# Patient Record
Sex: Female | Born: 1995 | Race: Black or African American | Hispanic: No | Marital: Single | Smoking: Never smoker
Health system: Southern US, Community
[De-identification: ages and names within clinical notes are randomized; demographics above are authoritative.]

## PROBLEM LIST (undated history)

## (undated) ENCOUNTER — Inpatient Hospital Stay: Payer: Self-pay

## (undated) DIAGNOSIS — J45909 Unspecified asthma, uncomplicated: Secondary | ICD-10-CM

## (undated) HISTORY — DX: Unspecified asthma, uncomplicated: J45.909

---
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
## 2016-01-26 ENCOUNTER — Encounter: Payer: PRIVATE HEALTH INSURANCE | Admitting: Obstetrics and Gynecology

## 2016-02-08 ENCOUNTER — Ambulatory Visit (INDEPENDENT_AMBULATORY_CARE_PROVIDER_SITE_OTHER): Payer: PRIVATE HEALTH INSURANCE | Admitting: Obstetrics and Gynecology

## 2016-02-08 ENCOUNTER — Other Ambulatory Visit: Payer: Self-pay | Admitting: Obstetrics and Gynecology

## 2016-02-08 VITALS — BP 114/67 | HR 94 | Wt 180.6 lb

## 2016-02-08 DIAGNOSIS — R7989 Other specified abnormal findings of blood chemistry: Secondary | ICD-10-CM

## 2016-02-08 DIAGNOSIS — Z3402 Encounter for supervision of normal first pregnancy, second trimester: Secondary | ICD-10-CM

## 2016-02-08 LAB — POCT URINALYSIS DIPSTICK
Bilirubin, UA: NEGATIVE
Blood, UA: NEGATIVE
GLUCOSE UA: NEGATIVE
Ketones, UA: NEGATIVE
Nitrite, UA: NEGATIVE
Protein, UA: NEGATIVE
SPEC GRAV UA: 1.02
UROBILINOGEN UA: 0.2
pH, UA: 6.5

## 2016-02-08 NOTE — Progress Notes (Signed)
ROB: Patient notes lower back pain.  Has not tried anything.  Recommend Tylenol and/or heating pads. For quad screen today.  Will recheck thyroid levels as last TSH was mildly elevated. For anatomy scan in 1-2 weeks. RTC in 4 weeks.

## 2016-02-09 LAB — THYROID PANEL WITH TSH
FREE THYROXINE INDEX: 1.4 (ref 1.2–4.9)
T3 UPTAKE RATIO: 15 % — AB (ref 24–39)
T4 TOTAL: 9.3 ug/dL (ref 4.5–12.0)
TSH: 1.01 u[IU]/mL (ref 0.450–4.500)

## 2016-02-10 ENCOUNTER — Telehealth: Payer: Self-pay

## 2016-02-10 NOTE — Telephone Encounter (Signed)
Pt informed

## 2016-02-10 NOTE — Telephone Encounter (Signed)
-----   Message from Hildred LaserAnika Cherry, MD sent at 02/09/2016  1:49 PM EDT ----- Please inform of normal thyroid levels.

## 2016-02-11 LAB — AFP, SERUM, OPEN SPINA BIFIDA
AFP MoM: 0.87
AFP Value: 41.8 ng/mL
GEST. AGE ON COLLECTION DATE: 19 wk
MATERNAL AGE AT EDD: 19.7 a
OSBR Risk 1 IN: 10000
Test Results:: NEGATIVE
WEIGHT: 180 [lb_av]

## 2016-02-14 ENCOUNTER — Encounter: Payer: Self-pay | Admitting: Internal Medicine

## 2016-02-14 ENCOUNTER — Ambulatory Visit (INDEPENDENT_AMBULATORY_CARE_PROVIDER_SITE_OTHER): Payer: PRIVATE HEALTH INSURANCE | Admitting: Internal Medicine

## 2016-02-14 VITALS — BP 116/68 | HR 93 | Temp 98.8°F | Wt 181.0 lb

## 2016-02-14 DIAGNOSIS — J359 Chronic disease of tonsils and adenoids, unspecified: Secondary | ICD-10-CM | POA: Diagnosis not present

## 2016-02-14 NOTE — Patient Instructions (Signed)

## 2016-02-14 NOTE — Progress Notes (Signed)
Subjective:    Patient ID: Connie Holmes, female    DOB: 12/23/1995, 20 y.o.   MRN: [id number]  HPI  Pt presents to the clinic today with c/o a "spot" on her tonsil. She noticed this 3 days ago. It is not causing her any pain or discomfort, she just notices it is there when she swallows. She denies runny nose, sore throat or cough. She has not tried anything OTC for this.  Review of Systems      Past Medical History  Diagnosis Date  . Childhood asthma     Current Outpatient Prescriptions  Medication Sig Dispense Refill  . Prenatal Multivit-Min-Fe-FA (PRENATAL VITAMINS PO) Take 1 tablet by mouth daily. Reported on 12/07/2015     No current facility-administered medications for this visit.    No Known Allergies  Family History  Problem Relation Age of Onset  . Asthma Mother   . Cancer Maternal Grandfather     lung  . Cancer Paternal Grandfather     lung  . Diabetes Paternal Grandmother   . Diabetes Mother   . Hypertension Mother   . Hypertension Maternal Grandfather   . Hypertension Paternal Grandmother   . Hypertension Maternal Grandmother     Social History   Social History  . Marital Status: Single    Spouse Name: N/A  . Number of Children: N/A  . 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 Smoking status: Never Smoker   . Smokeless tobacco: Never Used  . Alcohol Use: No  . Drug Use: No  . Sexual Activity:    Partners: Male    Birth Control/ Protection: None     Comment: Pregnant    Other Topics Concern  . Not on file   Social History Narrative     Constitutional: Denies fever, malaise, fatigue, headache or abrupt weight changes.  HEENT: Pt reports "spot" on tonsil. Denies eye pain, eye redness, ear pain, ringing in the ears, wax buildup, runny nose, nasal congestion, bloody nose, or sore throat. Respiratory: Denies difficulty breathing, shortness of breath, cough or sputum production.     Cardiovascular: Denies chest pain, chest tightness, palpitations or swelling in the hands or feet.   No other specific complaints in a complete review of systems (except as listed in HPI above).   Objective:   Physical Exam  BP 116/68 mmHg  Pulse 93  Temp(Src) 98.8 F (37.1 C) (Oral)  Wt 181 lb (82.101 kg)  SpO2 98%  LMP 09/28/2015 (Approximate) Wt Readings from Last 3 Encounters:  02/14/16 181 lb (82.101 kg) (95 %*, Z = 1.63)  02/08/16 180 lb 9.6 oz (81.92 kg) (95 %*, Z = 1.63)  12/29/15 175 lb 14.4 oz (79.788 kg) (94 %*, Z = 1.54)   * Growth percentiles are based on CDC 2-20 Years data.    General: Appears her stated age, well developed, well nourished in NAD. HEENT: Head: normal shape and size; Throat/Mouth: Teeth present, mucosa pink and moist, no exudate. Small papular lesion noted on left tonsillar piller.  Neck:  No adenopathy noted. Cardiovascular: Normal rate and rhythm. S1,S2 noted.  No murmur, rubs or gallops noted. Pulmonary/Chest: Normal effort and positive vesicular breath sounds. No respiratory distress. No wheezes, rales or ronchi noted.   CBC    Component Value Date/Time   WBC 4.0 12/07/2015 1203   WBC 5.8 05/11/2015 1716   RBC 4.40 12/07/2015 1203   RBC 3.98  05/11/2015 1716   HGB 12.7 05/11/2015 1716   HCT 40.9 12/07/2015 1203   HCT 38.5 05/11/2015 1716   PLT 463* 12/07/2015 1203   PLT 398.0 05/11/2015 1716   MCV 93 12/07/2015 1203   MCV 96.7 05/11/2015 1716   MCH 31.8 12/07/2015 1203   MCHC 34.2 12/07/2015 1203   MCHC 33.1 05/11/2015 1716   RDW 14.5 12/07/2015 1203   RDW 14.8 05/11/2015 1716   LYMPHSABS 1.0 12/07/2015 1203   EOSABS 0.1 12/07/2015 1203   BASOSABS 0.0 12/07/2015 1203    Hgb A1C Lab Results  Component Value Date   HGBA1C 5.3 12/07/2015            Assessment & Plan:   Tonsil lesion:  Not concerning at this time Will monitor for now If enlarges or becomes tender/painful, will refer to ENT for further evaluation She  can try salt water gargles OTC  RTC as needed

## 2016-02-17 ENCOUNTER — Telehealth: Payer: Self-pay

## 2016-02-17 NOTE — Telephone Encounter (Signed)
Called pt, line dropped. Will call again. Called x 2

## 2016-02-17 NOTE — Telephone Encounter (Signed)
-----   Message from Hildred LaserAnika Cherry, MD sent at 02/17/2016  7:35 AM EDT ----- Please inform of negative 2nd trimester screening

## 2016-02-24 ENCOUNTER — Inpatient Hospital Stay
Admission: EM | Admit: 2016-02-24 | Discharge: 2016-02-26 | DRG: 782 | Disposition: A | Discharge status: Home / Self Care | Payer: Medicaid Other | Attending: Obstetrics and Gynecology | Admitting: Obstetrics and Gynecology

## 2016-02-24 ENCOUNTER — Other Ambulatory Visit: Payer: Self-pay | Admitting: Obstetrics and Gynecology

## 2016-02-24 ENCOUNTER — Ambulatory Visit (INDEPENDENT_AMBULATORY_CARE_PROVIDER_SITE_OTHER): Payer: PRIVATE HEALTH INSURANCE

## 2016-02-24 ENCOUNTER — Encounter: Payer: Self-pay | Admitting: *Deleted

## 2016-02-24 DIAGNOSIS — Z3A21 21 weeks gestation of pregnancy: Secondary | ICD-10-CM

## 2016-02-24 DIAGNOSIS — O26872 Cervical shortening, second trimester: Principal | ICD-10-CM | POA: Diagnosis present

## 2016-02-24 DIAGNOSIS — Z3402 Encounter for supervision of normal first pregnancy, second trimester: Secondary | ICD-10-CM

## 2016-02-24 MED ORDER — SODIUM CHLORIDE 0.9 % IV SOLN
2.0000 g | Freq: Four times a day (QID) | INTRAVENOUS | Status: DC
  Administered 2016-02-24 – 2016-02-25 (×3): 2 g via INTRAVENOUS
  Filled 2016-02-24 (×3): qty 2000

## 2016-02-24 MED ORDER — SODIUM CHLORIDE 0.9 % IV SOLN
INTRAVENOUS | Status: AC
  Filled 2016-02-24: qty 2000

## 2016-02-24 MED ORDER — LACTATED RINGERS IV SOLN
INTRAVENOUS | Status: DC
  Administered 2016-02-24: 1000 mL via INTRAVENOUS
  Administered 2016-02-25 – 2016-02-26 (×3): via INTRAVENOUS

## 2016-02-24 MED ORDER — ACETAMINOPHEN 325 MG PO TABS: 650.0000 mg | ORAL_TABLET | ORAL | Status: DC | PRN

## 2016-02-24 MED ORDER — CALCIUM CARBONATE ANTACID 500 MG PO CHEW: 2.0000 | CHEWABLE_TABLET | ORAL | Status: DC | PRN

## 2016-02-24 MED ORDER — PROGESTERONE MICRONIZED 200 MG PO CAPS
200.0000 mg | ORAL_CAPSULE | Freq: Every day | ORAL | Status: DC
  Administered 2016-02-24 – 2016-02-25 (×2): 200 mg via VAGINAL
  Filled 2016-02-24 (×4): qty 1

## 2016-02-24 MED ORDER — ZOLPIDEM TARTRATE 5 MG PO TABS: 5.0000 mg | ORAL_TABLET | Freq: Every evening | ORAL | Status: DC | PRN

## 2016-02-24 MED ORDER — DOCUSATE SODIUM 100 MG PO CAPS
100.0000 mg | ORAL_CAPSULE | Freq: Every day | ORAL | Status: DC
  Administered 2016-02-25 – 2016-02-26 (×2): 100 mg via ORAL
  Filled 2016-02-24 (×2): qty 1

## 2016-02-24 NOTE — H&P (Signed)
Connie Holmes is a 20 y.o. female presenting for admission due to shortened cervical length identified at anatomy scan earlier today. Cervical length was 8.9 mm; funneling was identified.  Patient reports having some low back pain over the past several weeks which appeared to be constant. She denies UTI symptoms. She denies fevers chills or sweats. She denies nausea vomiting diarrhea. The patient has not noted any significant vaginal discharge or bleeding. Fetus is active. . Maternal Medical History:  Reason for admission: Nausea.    OB History    Gravida Para Term Preterm AB TAB SAB Ectopic Multiple Living   1              Past Medical History  Diagnosis Date  . Childhood asthma    History reviewed. No pertinent past surgical history. Family History: family history includes Asthma in her mother; Cancer in her maternal grandfather and paternal grandfather; Diabetes in her mother and paternal grandmother; Hypertension in her maternal grandfather, maternal grandmother, mother, and paternal grandmother. Social History:  reports that she has never smoked. She has never used smokeless tobacco. She reports that she does not drink alcohol or use illicit drugs.    Review of Systems  Constitutional: Negative for fever and chills.  HENT: Negative for congestion and sore throat.   Respiratory: Negative for cough, sputum production and shortness of breath.   Gastrointestinal: Negative for nausea, vomiting, abdominal pain and diarrhea.  Genitourinary: Negative for dysuria, urgency, hematuria and flank pain.  Skin: Negative for itching and rash.  Neurological: Negative.   Psychiatric/Behavioral: Negative.       Last menstrual period 09/28/2015. Exam Physical Exam  Constitutional: She is oriented to person, place, and time. She appears well-developed and well-nourished. No distress.  HENT:  Head: Normocephalic and atraumatic.  Cardiovascular: Normal rate and regular rhythm.    Respiratory: Effort normal.  GI: Soft. She exhibits no distension. There is no tenderness. There is no rebound and no guarding.  Genitourinary:  Sterile speculum exam: Cervix appears closed; approximately 1 cm in length; no cervical discharge  External genitalia-normal BUS-normal Vagina-normal; minimal white secretions and vaginal vault; GBS culture obtained Cervix-soft, closed, approximately 1 cm long Uterus-nontender Adnexa-nonpalpable nontender Rectovaginal-normal external exam  Musculoskeletal: She exhibits no edema or tenderness.  Neurological: She is alert and oriented to person, place, and time.  Skin: Skin is warm and dry. No rash noted. No erythema.  Psychiatric: She has a normal mood and affect. Her behavior is normal.    Prenatal labs: ABO, Rh: O/Positive/-- (03/15 1203) Antibody: Negative (03/15 1203) Rubella: <20.0 (03/15 1203) RPR: Non Reactive (03/15 1203)  HBsAg: Negative (03/15 1203)  HIV: Non Reactive (03/15 1203)  GBS:     Assessment/Plan: G1 P0 at 21 0.[redacted] weeks gestation Short cervix on ultrasound; cervical length 8.9 mm; funneling present  PLAN: 1. IV fluids 2. Monitor for contractions/preterm labor 3. GBS culture 4. Ampicillin 2 g IV every 6 hours 5. Progesterone suppositories 200 mg daily 6. Protocol for short cervix in nulliparous patient was reviewed with the patient and her family. According to quotes up to date", a G1 P0 patient with no prior history of preterm delivery, emergency cerclage is not considered to be helpful. Recommendation is for progesterone suppositories 200 mg daily until 36 weeks or delivery. Repeat cervical length scanning is also not recommended in this scenario.   Daphine DeutscherMartin A Leva Baine 02/24/2016, 6:10 PM

## 2016-02-24 NOTE — OB Triage Note (Signed)
Sent from office for cervical cerclage due to shortened cervix with funneling.  Dr. Greggory KeeneFrancesco here and has decided to hold off on cerclage for now and administer progesterone suppositories.

## 2016-02-25 DIAGNOSIS — Z3A21 21 weeks gestation of pregnancy: Secondary | ICD-10-CM | POA: Diagnosis not present

## 2016-02-25 DIAGNOSIS — R110 Nausea: Secondary | ICD-10-CM | POA: Diagnosis present

## 2016-02-25 DIAGNOSIS — O26872 Cervical shortening, second trimester: Secondary | ICD-10-CM | POA: Diagnosis present

## 2016-02-25 MED ORDER — PRENATAL MULTIVITAMIN CH
1.0000 | ORAL_TABLET | Freq: Every day | ORAL | Status: DC
  Administered 2016-02-25 – 2016-02-26 (×2): 1 via ORAL
  Filled 2016-02-25 (×2): qty 1

## 2016-02-25 NOTE — Progress Notes (Signed)
Patient ID: Erich MontaneQuantaza Holmes, female   DOB: 02/27/1996, 20 y.o.   MRN: [id number]  HOSPITAL DAY #1; AMPICILLIN DAY 1 21.3 WEEK IUP-SHORT CERVIX WITH FUNNELING  S: No abdominal pain. Good FM O: BP 129/64 mmHg  Pulse 74  Temp(Src) 97.8 F (36.6 C) (Oral)  Resp 16  Ht 5\' 2"  (1.575 m)  Wt 184 lb (83.462 kg)  BMI 33.65 kg/m2  LMP 09/28/2015 (Approximate)  EFM: no contractions  Alert oriented in NAD  Abd: soft non tender; uterus non tender  Ext: non tender   GBS culture pending  A: Short Cervix with funneling; G1; No risk factors for CI; No evidence for PML P:  1. D/C Ampicillin  2. Regular diet  3. Continue PV progesterone suppository 200 mg daily  4. Transfer to floor  5. Bed rest with BR prvileges; pt may shower  6. Consider MFM consult for long term management.  Herold HarmsMartin A Ajahnae Rathgeber, MD

## 2016-02-26 ENCOUNTER — Encounter: Payer: Self-pay | Admitting: Obstetrics and Gynecology

## 2016-02-26 LAB — CULTURE, BETA STREP (GROUP B ONLY)

## 2016-02-26 LAB — URINE CULTURE
CULTURE: NO GROWTH
SPECIAL REQUESTS: NORMAL

## 2016-02-26 MED ORDER — PROGESTERONE 100 MG VA INST: 100.0000 mg | VAGINAL_INSERT | Freq: Two times a day (BID) | VAGINAL | Status: DC

## 2016-02-26 NOTE — Discharge Instructions (Signed)
Cervical Insufficiency °Cervical insufficiency is when the cervix is weak and starts to open (dilate) and thin (efface) before the pregnancy is at term and without labor starting. This is also called incompetent cervix. It can happen in the second or third trimester when the fetus starts putting pressure on the cervix. Cervical insufficiency can lead to a miscarriage, preterm premature rupture of the membranes (PPROM), or having the baby early (preterm birth).  °RISK FACTORS °You may be more likely to develop cervical insufficiency if: °· You have a shorter cervix than normal. °· Damage or injury occurred to your cervix from a past pregnancy or surgery. °· You were born with a cervical defect. °· You have had a procedure done on the cervix, such as cervical biopsy. °· You have a history of cervical insufficiency. °· You have a history of PPROM. °· You have ended several past pregnancies through abortion. °· You were exposed to the drug diethylstilbestrol (DES). °SYMPTOMS °Often times, women do not have any symptoms. Other times, women may only have mild symptoms that often start between week 14 through 20. The symptoms may last several days or weeks. These symptoms include: °· Light spotting or bleeding from the vagina. °· Pelvic pressure. °· A change in vaginal discharge, such as discharge that changes from clear, white, or light yellow to pink or tan. °· Back pain. °· Abdominal pain or cramping. °DIAGNOSIS °Cervical insufficiency cannot be diagnosed before you become pregnant. Once you are pregnant, your health care provider will ask about your medical history and if you have had any problems in past pregnancies. Tell your health care provider about any procedures performed on your cervix or if you have a history of miscarriages or cervical insufficiency. If your health care provider thinks you are at high risk for cervical insufficiency or show signs of cervical insufficiency, he or she may: °· Perform a pelvic  exam. This will check for: °¨ The presence of the membranes (amniotic sac) coming out of the cervix. °¨ Cervical abnormalities. °¨ Cervical injuries. °¨ The presence of contractions. °· Perform an ultrasonography (commonly called ultrasound) to measure the length and thickness of the cervix. °TREATMENT °If you have been diagnosed with cervical insufficiency, your health care provider may recommend: °· Limiting physical activity. °· Bed rest at home or in the hospital. °· Pelvic rest, which means no sexual intercourse or placing anything in the vagina. °· Cerclage to sew the cervix closed and prevent it from opening too early. The stitches (sutures) are removed between weeks 36 and 38 to avoid problems during labor. °Cerclage may be recommended during pregnancy if you have had a history of miscarriages or preterm births without a known cause. It may also be recommended if you have a short cervix that was identified by ultrasound or if your health care provider has found that your cervix has dilated before 24 weeks of pregnancy. Limiting physical activity and bed rest may or may not help prevent a preterm birth. °WHEN SHOULD YOU SEEK IMMEDIATE MEDICAL CARE?  °Seek immediate medical care if you show any symptoms of cervical insufficiency. You will need to go to the hospital to get checked immediately. °  °This information is not intended to replace advice given to you by your health care provider. Make sure you discuss any questions you have with your health care provider. °  °Document Released: 09/10/2005 Document Revised: 10/01/2014 Document Reviewed: 11/17/2012 °Elsevier Interactive Patient Education ©2016 [employer] ° °

## 2016-02-26 NOTE — Progress Notes (Signed)
  Antenatal Progress Note  Subjective:     Patient ID: Connie Holmes is a 20 y.o. female at 3772w4d, Estimated Date of Delivery: 07/04/16 by Patient's last menstrual period was 09/28/2015 (approximate). consistent with 1st trimester sono who was admitted for short cervix with funneling.  HD# 2.  Subjective:  Patient denies complaints today.  Tolerating diet.    Review of Systems Denies contractions, leakage of fluids, vaginal bleeding, and reports good fetal movement.     Objective:   Filed Vitals:   02/25/16 1655 02/25/16 2023 02/26/16 0800 02/26/16 1216  BP: 117/61 122/62 121/62 116/51  Pulse: 80 93 85 81  Temp: 98.4 F (36.9 C) 98.4 F (36.9 C) 98.3 F (36.8 C) 98.3 F (36.8 C)  TempSrc: Oral Oral Oral Oral  Resp: 16 18 18 18   Height:      Weight:      SpO2:  100% 100%     General appearance: alert and no distress Lungs: clear to auscultation bilaterally Heart: regular rate and rhythm, S1, S2 normal, no murmur, click, rub or gallop Abdomen: soft, non-tender; bowel sounds normal; no masses,  no organomegaly.  Gravid Pelvic: deferred Extremities: extremities normal, atraumatic, no cyanosis or edema   FHT: appreciated   Labs:  GBS pending 02/24/16: Urine culture negative  Assessment:  20 y.o. female 4472w4d, Estimated Date of Delivery: 07/04/16 with:  1. Shortened cervix with funneling   Plan:   1. Regular diet 2. Continue PV progesterone suppository 200 mg daily.  Does not require cerclage at this time based on ACOG recommendations.  3. Will d/c home today.  Patient to maintain pelvic rest.  Can have modified daily activity (no strenous lifting, excessive bending).  4. Will likely have patient f/u with MFM as outpatient.    Hildred LaserAnika Laiba Fuerte, MD [facility] 02/26/2016 1:14 PM

## 2016-02-26 NOTE — Discharge Summary (Signed)
Physician Discharge Summary  Patient ID: Connie Holmes MRN: [id number] DOB/AGE: 19/04/1996 19 y.o.  Admit date: 02/24/2016 Discharge date: 02/26/2016  Admission Diagnoses:  Shortened cervix with funneling  Discharge Diagnoses:  Principal Problem:   Short cervix in second trimester, antepartum   Discharged Condition: good  Hospital Course:  The patient was admitted for observation due to findings of shortened cervix to assess if rescue cerclage was needed.  She was treated for 24 hrs with ampicillin. Patient remained without contractions, no cervical dilation noted.  She was initiated on vaginal progesterone inserts 100 mg BID. Patient was discharged on HD#2.   Consults: None  Significant Diagnostic Studies: radiology: Ultrasound: OB anatomy scan (performed in office)  Indications: Anatomy Scan Findings:  Singleton intrauterine pregnancy is visualized with FHR at 155 BPM. Biometrics give an (U/S) Gestational age of [redacted] weeks and 1 day, and an (U/S) EDD of 07/05/16; this correlates with the clinically established EDD of 07/04/16.  Fetal presentation is vertex, spine posterior.  EFW: 393 grams ( 0 lbs. 14 oz.). Placenta: Anterior, grade 0 and remote to cervix by 5.7 cm.  AFI: Adequate with MVP of 3.4 cm.   CX: During Transabdominal scan, it was noted that the cervix appeared short with possible funneling. A transvaginal ultrasound was performed that verified that the cervix was short at 9.2 mm and 7.3 mm with fundal pressure.  Anatomic survey is complete and appears WNL. Gender - Female.   Right Ovary measures 3.8 x 2.6 x 3.0 cm, and appears WNL. Left Ovary measures 3.8 x 2.4 x 2.2 cm, and appears WNL. There is no evidence of a corpus luteal cyst.. Survey of the adnexa demonstrates no adnexal masses. There is no free peritoneal fluid in the cul de sac.  Impression: 1. 21 week 1 day Viable Singleton Intrauterine pregnancy by U/S. 2. (U/S) EDD is consistent with Clinically  established (LMP) EDD of 07/04/16. 3. Normal appearing anatomy scan. 4. Shortened cervix with funneling. Cervix at 9.2 mm and 7.3 mm with fundal pressure  Recommendations: 1.Clinical correlation with the patient's History and Physical Exam. 2. The on-call doctor ( Dr. Greggory Keenefrancesco) was called and patient was told [facility].   Treatments: antibiotics: ampicillin; and progesterone  Discharge Exam: Blood pressure 116/51, pulse 81, temperature 98.3 F (36.8 C), temperature source Oral, resp. rate 18, height 5\' 2"  (1.575 m), weight 184 lb (83.462 kg), last menstrual period 09/28/2015, SpO2 100 %. General appearance: alert and no distress Lungs: clear to auscultation bilaterally Heart: regular rate and rhythm, S1, S2 normal, no murmur, click, rub or gallop Abdomen: soft, non-tender; bowel sounds normal; no masses, no organomegaly. Gravid Pelvic: deferred Extremities: extremities normal, atraumatic, no cyanosis or edema  FHT: appreciated  Disposition: Home     Medication List    TAKE these medications        PRENATAL VITAMINS PO  Take 1 tablet by mouth daily. Reported on 12/07/2015     progesterone 100 MG vaginal insert  Commonly known as:  ENDOMETRIN  Place 1 tablet (100 mg total) vaginally 2 (two) times daily.           Follow-up Information    Follow up with Connie LaserAnika Estella Malatesta, MD In 1 week.   Specialties:  Obstetrics and Gynecology, Radiology   Contact information:   [address] [place] [address] [phone]       Signed: Hildred LaserAnika Tara Wich, MD [facility] 02/26/2016, 2:16 PM

## 2016-02-26 NOTE — Progress Notes (Signed)
Patient understands all discharge instructions and the need to make follow up appointments. Patient discharge via wheelchair with RN. 

## 2016-02-29 ENCOUNTER — Inpatient Hospital Stay
Admission: EM | Admit: 2016-02-29 | Discharge: 2016-03-01 | DRG: 775 | Disposition: A | Discharge status: Home / Self Care | Payer: Medicaid Other | Attending: Obstetrics and Gynecology | Admitting: Obstetrics and Gynecology

## 2016-02-29 ENCOUNTER — Observation Stay: Payer: Medicaid Other

## 2016-02-29 DIAGNOSIS — O26872 Cervical shortening, second trimester: Secondary | ICD-10-CM

## 2016-02-29 DIAGNOSIS — Z3402 Encounter for supervision of normal first pregnancy, second trimester: Secondary | ICD-10-CM

## 2016-02-29 DIAGNOSIS — R109 Unspecified abdominal pain: Secondary | ICD-10-CM

## 2016-02-29 DIAGNOSIS — O26879 Cervical shortening, unspecified trimester: Secondary | ICD-10-CM | POA: Diagnosis present

## 2016-02-29 DIAGNOSIS — Z3A22 22 weeks gestation of pregnancy: Secondary | ICD-10-CM

## 2016-02-29 DIAGNOSIS — O26899 Other specified pregnancy related conditions, unspecified trimester: Secondary | ICD-10-CM

## 2016-02-29 DIAGNOSIS — O328XX0 Maternal care for other malpresentation of fetus, not applicable or unspecified: Secondary | ICD-10-CM | POA: Diagnosis present

## 2016-02-29 DIAGNOSIS — O3432 Maternal care for cervical incompetence, second trimester: Secondary | ICD-10-CM | POA: Diagnosis present

## 2016-02-29 LAB — URINALYSIS COMPLETE WITH MICROSCOPIC (ARMC ONLY)
BILIRUBIN URINE: NEGATIVE
Glucose, UA: NEGATIVE mg/dL
Hgb urine dipstick: NEGATIVE
KETONES UR: NEGATIVE mg/dL
NITRITE: NEGATIVE
PH: 6 (ref 5.0–8.0)
Protein, ur: 30 mg/dL — AB
Specific Gravity, Urine: 1.024 (ref 1.005–1.030)

## 2016-02-29 LAB — CBC
HCT: 32.2 % — ABNORMAL LOW (ref 35.0–47.0)
HEMOGLOBIN: 10.8 g/dL — AB (ref 12.0–16.0)
MCH: 31.5 pg (ref 26.0–34.0)
MCHC: 33.5 g/dL (ref 32.0–36.0)
MCV: 94 fL (ref 80.0–100.0)
PLATELETS: 409 10*3/uL (ref 150–440)
RBC: 3.42 MIL/uL — AB (ref 3.80–5.20)
RDW: 14.4 % (ref 11.5–14.5)
WBC: 12.3 10*3/uL — ABNORMAL HIGH (ref 3.6–11.0)

## 2016-02-29 LAB — CREATININE, SERUM
CREATININE: 0.48 mg/dL (ref 0.44–1.00)
GFR calc Af Amer: >60 mL/min (ref >60)

## 2016-02-29 LAB — CHLAMYDIA/NGC RT PCR (ARMC ONLY)
Chlamydia Tr: DETECTED — AB
N gonorrhoeae: NOT DETECTED

## 2016-02-29 LAB — TYPE AND SCREEN
ABO/RH(D): O POS
Antibody Screen: NEGATIVE

## 2016-02-29 MED ORDER — ENOXAPARIN SODIUM 40 MG/0.4ML ~~LOC~~ SOLN
40.0000 mg | SUBCUTANEOUS | Status: DC
  Administered 2016-02-29: 40 mg via SUBCUTANEOUS
  Filled 2016-02-29: qty 0.4

## 2016-02-29 MED ORDER — ZOLPIDEM TARTRATE 5 MG PO TABS: 5.0000 mg | ORAL_TABLET | Freq: Every evening | ORAL | Status: DC | PRN

## 2016-02-29 MED ORDER — DIBUCAINE 1 % RE OINT
1.0000 | TOPICAL_OINTMENT | RECTAL | Status: DC | PRN
Start: 2016-02-29 — End: 2016-03-01

## 2016-02-29 MED ORDER — IBUPROFEN 600 MG PO TABS
600.0000 mg | ORAL_TABLET | Freq: Four times a day (QID) | ORAL | Status: DC
  Administered 2016-03-01: 600 mg via ORAL
  Filled 2016-02-29: qty 1

## 2016-02-29 MED ORDER — PRENATAL MULTIVITAMIN CH
1.0000 | ORAL_TABLET | Freq: Every day | ORAL | Status: DC
  Administered 2016-02-29: 1 via ORAL
  Filled 2016-02-29: qty 1

## 2016-02-29 MED ORDER — TETANUS-DIPHTH-ACELL PERTUSSIS 5-2.5-18.5 LF-MCG/0.5 IM SUSP
0.5000 mL | Freq: Once | INTRAMUSCULAR | Status: DC
  Filled 2016-02-29: qty 0.5

## 2016-02-29 MED ORDER — LACTATED RINGERS IV SOLN
INTRAVENOUS | Status: DC
  Administered 2016-02-29 (×2): via INTRAVENOUS

## 2016-02-29 MED ORDER — DIPHENHYDRAMINE HCL 12.5 MG/5ML PO ELIX
12.5000 mg | ORAL_SOLUTION | Freq: Four times a day (QID) | ORAL | Status: DC | PRN
  Filled 2016-02-29: qty 5

## 2016-02-29 MED ORDER — NALOXONE HCL 0.4 MG/ML IJ SOLN: 0.4000 mg | INTRAMUSCULAR | Status: DC | PRN

## 2016-02-29 MED ORDER — WITCH HAZEL-GLYCERIN EX PADS
1.0000 | MEDICATED_PAD | CUTANEOUS | Status: DC | PRN
Start: 2016-02-29 — End: 2016-03-01

## 2016-02-29 MED ORDER — ACETAMINOPHEN-CODEINE #3 300-30 MG PO TABS
1.0000 | ORAL_TABLET | ORAL | Status: DC | PRN
  Administered 2016-02-29: 1 via ORAL
  Administered 2016-02-29: 2 via ORAL
  Filled 2016-02-29: qty 2

## 2016-02-29 MED ORDER — NIFEDIPINE 10 MG PO CAPS: 20.0000 mg | ORAL_CAPSULE | Freq: Four times a day (QID) | ORAL | Status: DC

## 2016-02-29 MED ORDER — OXYCODONE-ACETAMINOPHEN 5-325 MG PO TABS
1.0000 | ORAL_TABLET | Freq: Four times a day (QID) | ORAL | Status: DC | PRN
  Administered 2016-02-29: 2 via ORAL
  Filled 2016-02-29: qty 2

## 2016-02-29 MED ORDER — COCONUT OIL OIL: 1.0000 "application " | TOPICAL_OIL | Status: DC | PRN

## 2016-02-29 MED ORDER — FENTANYL CITRATE (PF) 100 MCG/2ML IJ SOLN
INTRAMUSCULAR | Status: AC
  Administered 2016-02-29: 50 ug via INTRAVENOUS
  Filled 2016-02-29: qty 2

## 2016-02-29 MED ORDER — PROGESTERONE 200 MG VA SUPP
200.0000 mg | Freq: Every day | VAGINAL | Status: DC
  Filled 2016-02-29: qty 1

## 2016-02-29 MED ORDER — TERBUTALINE SULFATE 1 MG/ML IJ SOLN
0.2500 mg | INTRAMUSCULAR | Status: DC | PRN
  Administered 2016-02-29 (×3): 0.25 mg via SUBCUTANEOUS
  Filled 2016-02-29: qty 1

## 2016-02-29 MED ORDER — ACETAMINOPHEN 325 MG PO TABS: 650.0000 mg | ORAL_TABLET | ORAL | Status: DC | PRN

## 2016-02-29 MED ORDER — ACETAMINOPHEN-CODEINE #3 300-30 MG PO TABS
ORAL_TABLET | ORAL | Status: AC
  Administered 2016-02-29: 1 via ORAL
  Filled 2016-02-29: qty 2

## 2016-02-29 MED ORDER — BENZOCAINE-MENTHOL 20-0.5 % EX AERO: 1.0000 "application " | INHALATION_SPRAY | CUTANEOUS | Status: DC | PRN

## 2016-02-29 MED ORDER — DOCUSATE SODIUM 100 MG PO CAPS
100.0000 mg | ORAL_CAPSULE | Freq: Every day | ORAL | Status: DC
  Administered 2016-02-29: 100 mg via ORAL
  Filled 2016-02-29: qty 1

## 2016-02-29 MED ORDER — CALCIUM CARBONATE ANTACID 500 MG PO CHEW: 2.0000 | CHEWABLE_TABLET | ORAL | Status: DC | PRN

## 2016-02-29 MED ORDER — LIDOCAINE HCL (PF) 1 % IJ SOLN
INTRAMUSCULAR | Status: AC
  Filled 2016-02-29: qty 30

## 2016-02-29 MED ORDER — FENTANYL CITRATE (PF) 100 MCG/2ML IJ SOLN
50.0000 ug | Freq: Once | INTRAMUSCULAR | Status: AC
  Administered 2016-02-29: 50 ug via INTRAVENOUS

## 2016-02-29 MED ORDER — ACETAMINOPHEN 325 MG PO TABS
650.0000 mg | ORAL_TABLET | ORAL | Status: DC | PRN
  Filled 2016-02-29: qty 2

## 2016-02-29 MED ORDER — LACTATED RINGERS IV BOLUS (SEPSIS)
500.0000 mL | Freq: Once | INTRAVENOUS | Status: AC
  Administered 2016-02-29: 500 mL via INTRAVENOUS

## 2016-02-29 MED ORDER — SIMETHICONE 80 MG PO CHEW: 80.0000 mg | CHEWABLE_TABLET | ORAL | Status: DC | PRN

## 2016-02-29 MED ORDER — LACTATED RINGERS IV SOLN: INTRAVENOUS | Status: DC

## 2016-02-29 MED ORDER — DIPHENHYDRAMINE HCL 50 MG/ML IJ SOLN: 12.5000 mg | Freq: Four times a day (QID) | INTRAMUSCULAR | Status: DC | PRN

## 2016-02-29 MED ORDER — ACETAMINOPHEN 325 MG PO TABS
650.0000 mg | ORAL_TABLET | Freq: Three times a day (TID) | ORAL | Status: DC | PRN
  Administered 2016-02-29: 650 mg via ORAL
  Filled 2016-02-29: qty 2

## 2016-02-29 MED ORDER — AMMONIA AROMATIC IN INHA
RESPIRATORY_TRACT | Status: AC
  Filled 2016-02-29: qty 10

## 2016-02-29 MED ORDER — MEASLES, MUMPS & RUBELLA VAC ~~LOC~~ INJ
0.5000 mL | INJECTION | Freq: Once | SUBCUTANEOUS | Status: DC
  Filled 2016-02-29: qty 0.5

## 2016-02-29 MED ORDER — ONDANSETRON HCL 4 MG/2ML IJ SOLN: 4.0000 mg | Freq: Four times a day (QID) | INTRAMUSCULAR | Status: DC | PRN

## 2016-02-29 MED ORDER — HYDROXYPROGESTERONE CAPROATE 250 MG/ML IM OIL
250.0000 mg | TOPICAL_OIL | INTRAMUSCULAR | Status: DC
  Administered 2016-02-29: 250 mg via INTRAMUSCULAR
  Filled 2016-02-29: qty 1

## 2016-02-29 MED ORDER — MISOPROSTOL 200 MCG PO TABS
ORAL_TABLET | ORAL | Status: AC
  Filled 2016-02-29: qty 4

## 2016-02-29 MED ORDER — SODIUM CHLORIDE 0.9% FLUSH: 9.0000 mL | INTRAVENOUS | Status: DC | PRN

## 2016-02-29 MED ORDER — OXYTOCIN 10 UNIT/ML IJ SOLN
INTRAMUSCULAR | Status: AC
  Filled 2016-02-29: qty 2

## 2016-02-29 MED ORDER — MORPHINE SULFATE 2 MG/ML IV SOLN: INTRAVENOUS | Status: DC

## 2016-02-29 MED ORDER — NIFEDIPINE ER 30 MG PO TB24
30.0000 mg | ORAL_TABLET | Freq: Once | ORAL | Status: AC
  Administered 2016-02-29: 30 mg via ORAL
  Filled 2016-02-29: qty 1

## 2016-02-29 MED ORDER — FENTANYL CITRATE (PF) 100 MCG/2ML IJ SOLN
50.0000 ug | INTRAMUSCULAR | Status: DC | PRN
Start: 2016-02-29 — End: 2016-02-29
  Administered 2016-02-29 (×2): 50 ug via INTRAVENOUS
  Filled 2016-02-29: qty 2

## 2016-02-29 MED ORDER — DOCUSATE SODIUM 100 MG PO CAPS: 100.0000 mg | ORAL_CAPSULE | Freq: Two times a day (BID) | ORAL | Status: DC

## 2016-02-29 NOTE — OB Triage Note (Signed)
Pt presents to L&D with c/o abd cramping. Pt was seen in hospital over the weekend for cervical shortening and was discharged Sunday with a prescription for progesterone suppositories. Pt states she has had difficulty obtaining the prescription. She went Monday after 3 as directed and they told her it was not ready because they needed to order it, to come back the following day after 3pm, when she went Tuesday they informed her that they did not order it because it was $200 and they were unsure if she could pay for it. Pt reports cramping since 2pm today. Reports good fetal movement and denies leaking fluid or vaginal bleeding. Pt also denies intercourse. EFM applied and explained. Plan to monitor fetal and maternal well being and assess for preterm labor.

## 2016-02-29 NOTE — H&P (Signed)
OBSTETRICS HISTORY AND PHYSICAL   Connie Holmes is a 20 y.o. G1P0 female at [redacted]w[redacted]d, Estimated Date of Delivery: 07/04/16 with Patient's last menstrual period was 09/28/2015 (approximate). Patient presenting for admission due to shortened cervical length and complaints of cramping.  Cervical length last week was 8.9 mm; funneling was identified.  Was discharged after 2 days for home bed rest as patient was noted to be symptomatic.   Patient reports cramping overnight. She denies UTI symptoms. She denies fevers chills or sweats. She denies nausea vomiting diarrhea. The patient has not noted any significant vaginal discharge or bleeding. Fetus is active.  Was unable to get progesterone inserts due to pharmacy issues. Repeat ultrasound overnight notes open cervix with bulging membranes.  . Maternal Medical History:  Reason for admission: Contractions.  Nausea. Cramping   Contractions: Onset was 6-12 hours ago.   Frequency: irregular.    Fetal activity: Perceived fetal activity is normal.   Last perceived fetal movement was within the past hour.    Prenatal complications: No bleeding, PIH or placental abnormality.     OB History    Gravida Para Term Preterm AB TAB SAB Ectopic Multiple Living   1              Past Medical History  Diagnosis Date  . Childhood asthma     History reviewed. No pertinent past surgical history.   Family History  Problem Relation Age of Onset  . Asthma Mother   . Cancer Maternal Grandfather     lung  . Cancer Paternal Grandfather     lung  . Diabetes Paternal Grandmother   . Diabetes Mother   . Hypertension Mother   . Hypertension Maternal Grandfather   . Hypertension Paternal Grandmother   . Hypertension Maternal Grandmother     Social History   Social History  . Marital Status: Single    Spouse Name: N/A  . Number of Children: N/A  . 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  Smoking status: Never Smoker   . Smokeless tobacco: Never Used  . Alcohol Use: No  . Drug Use: No  . Sexual Activity:    Partners: Male    Birth Control/ Protection: None     Comment: Pregnant    Other Topics Concern  . Not on file   Social History Narrative    No current facility-administered medications on file prior to encounter.   Current Outpatient Prescriptions on File Prior to Encounter  Medication Sig Dispense Refill  . Prenatal Multivit-Min-Fe-FA (PRENATAL VITAMINS PO) Take 1 tablet by mouth daily. Reported on 12/07/2015    . progesterone (ENDOMETRIN) 100 MG vaginal insert Place 1 tablet (100 mg total) vaginally 2 (two) times daily. 21 each 12    No Known Allergies   Review of Systems  Constitutional: Negative for fever and chills.  HENT: Negative for congestion and sore throat.   Respiratory: Negative for cough, sputum production and shortness of breath.   Gastrointestinal: Negative for nausea, vomiting, abdominal pain and diarrhea.  Genitourinary: Negative for dysuria, urgency, hematuria and flank pain.       Pelvic cramping  Skin: Negative for itching and rash.  Neurological: Negative.   Psychiatric/Behavioral: Negative.       Blood pressure 128/59, pulse 87, temperature 98.1 F (36.7 C), temperature source Oral, resp. rate 18, height  (1.575 m), weight 181 lb (82.101 kg), last menstrual period 09/28/2015. Exam  Physical Exam  Constitutional: She is oriented to person, place, and time. She appears well-developed and well-nourished. No distress.  HENT:  Head: Normocephalic and atraumatic.  Cardiovascular: Normal rate and regular rhythm.   Respiratory: Effort normal.  GI: Soft. She exhibits no distension. There is no tenderness. There is no rebound and no guarding.  Genitourinary:  Sterile speculum exam: Cervix not visible, bulging membranes present.   External genitalia-normal BUS-normal Vagina-normal;  Bimanual not performed Rectovaginal-normal  external exam  Musculoskeletal: She exhibits no edema or tenderness.  Neurological: She is alert and oriented to person, place, and time.  Skin: Skin is warm and dry. No rash noted. No erythema.  Psychiatric: She has a normal mood and affect. Her behavior is normal.    Prenatal labs: ABO, Rh: O/Positive/-- (03/15 1203) Antibody: Negative (03/15 1203) Rubella: <20.0 (03/15 1203) RPR: Non Reactive (03/15 1203)  HBsAg: Negative (03/15 1203)  HIV: Non Reactive (03/15 1203)  GBS:     Assessment/Plan: G1 P0 at [redacted] weeks gestation Short cervix, now with cervical incompetency; advanced dilation with bulging membranes  PLAN: 1. IV fluids 2. Monitor for contractions/preterm labor 3. GBS culture performed last visit, negative 4. Trendelenberg positioning 5. Bed rest.  6. Progesterone injections weekly.  7. Patient not yet at gestational age of viability, not a candidate for antenatal steroids at this time, will administer at 23 weeks.  8. Will consider transfer at 23-24 weeks in case of preterm delivery.  9. MFM consult.     Hildred Lasernika Opie Fanton 02/29/2016, 7:59 AM

## 2016-02-29 NOTE — Progress Notes (Signed)
Antenatal Progress Note  S: Patient complains of bright red vaginal bleeding, notes that she was feeling cramping and contractions earlier.   O: Blood pressure 135/42, pulse 112, temperature 98.2 F (36.8 C), temperature source Oral, resp. rate 16, height 5\' 2"  (1.575 m), weight 181 lb (82.101 kg), last menstrual period 09/28/2015. Gen App: NAD, comfortable Abdomen: soft, gravid FHT: 165 bpm.  Not continuously monitoring.  Tocometer: contractions irregular (previously was q 1-3 minutes, given dose of terbutaline x 1) Cervix: deferred.  Scant red blood at perineum.  Extremities: Nontender, no edema.   Labs:   Results for orders placed or performed during the hospital encounter of 02/29/16  Urinalysis complete, with microscopic ([facility] only)  Result Value Ref Range   Color, Urine YELLOW (A) YELLOW   APPearance CLOUDY (A) CLEAR   Glucose, UA NEGATIVE NEGATIVE mg/dL   Bilirubin Urine NEGATIVE NEGATIVE   Ketones, ur NEGATIVE NEGATIVE mg/dL   Specific Gravity, Urine 1.024 1.005 - 1.030   Hgb urine dipstick NEGATIVE NEGATIVE   pH 6.0 5.0 - 8.0   Protein, ur 30 (A) NEGATIVE mg/dL   Nitrite NEGATIVE NEGATIVE   Leukocytes, UA 1+ (A) NEGATIVE   RBC / HPF 0-5 0 - 5 RBC/hpf   WBC, UA TOO NUMEROUS TO COUNT 0 - 5 WBC/hpf   Bacteria, UA MANY (A) NONE SEEN   Squamous Epithelial / LPF 6-30 (A) NONE SEEN   Mucous PRESENT    Ca Oxalate Crys, UA PRESENT   CBC  Result Value Ref Range   WBC 12.3 (H) 3.6 - 11.0 K/uL   RBC 3.42 (L) 3.80 - 5.20 MIL/uL   Hemoglobin 10.8 (L) 12.0 - 16.0 g/dL   HCT 11.932.2 (L) 14.735.0 - 82.947.0 %   MCV 94.0 80.0 - 100.0 fL   MCH 31.5 26.0 - 34.0 pg   MCHC 33.5 32.0 - 36.0 g/dL   RDW 56.214.4 13.011.5 - 86.514.5 %   Platelets 409 150 - 440 K/uL  Creatinine, serum  Result Value Ref Range   Creatinine, Ser 0.48 0.44 - 1.00 mg/dL   GFR calc non Af Amer >60 >60 mL/min   GFR calc Af Amer >60 >60 mL/min  Type and screen [facility]  Result Value Ref Range   ABO/RH(D)  O POS    Antibody Screen NEG    Sample Expiration 03/03/2016      Assessment:  1: SIUP at 2947w0d 2. Likely preterm labor 3. Cervical incompetency  Plan:  1. Will attempt to maintain pregnancy as long as possible, however anticipating vaginal delivery sometime today.  2. S/p Neonatology consult 3. Terbutaline prn for contractions 4. Awaiting Makena injection.     Hildred LaserAnika Sarae Nicholes, MD 02/29/2016 1:39 PM

## 2016-02-29 NOTE — Clinical Social Work Note (Signed)
CSW received consult from nursing at request of patient's mother. Nursing did not know what patient's mother was needing. Nursing informed CSW that patient would not be leaving today and may need to remain in hospital for an extended period of time depending on her pregnancy. Patient does not have any other children, this would be her first.  CSW full assessment to follow. York SpanielMonica Martavis Gurney MSW,LCSW

## 2016-02-29 NOTE — Progress Notes (Signed)
Antenatal Progress Note  S: Patient complains of worsening pain, currently 6/10.  Tylenol #3 not helping.  O: Blood pressure 134/56, pulse 109, temperature 98.5 F (36.9 C), temperature source Oral, resp. rate 16, height 5\' 2"  (1.575 m), weight 181 lb (82.101 kg), last menstrual period 09/28/2015. Gen App: NAD, comfortable Abdomen: soft, gravid FHT: 165 bpm.  Not continuously monitoring.  Tocometer: contractions palpable q 3 min , not tracing on monitor though,  Cervix: High bulging membranes with feet palpable. Scant red blood at perineum.  Extremities: Nontender, no edema.    Labs:   Results for orders placed or performed during the hospital encounter of 02/29/16  Urinalysis complete, with microscopic ([facility] only)  Result Value Ref Range   Color, Urine YELLOW (A) YELLOW   APPearance CLOUDY (A) CLEAR   Glucose, UA NEGATIVE NEGATIVE mg/dL   Bilirubin Urine NEGATIVE NEGATIVE   Ketones, ur NEGATIVE NEGATIVE mg/dL   Specific Gravity, Urine 1.024 1.005 - 1.030   Hgb urine dipstick NEGATIVE NEGATIVE   pH 6.0 5.0 - 8.0   Protein, ur 30 (A) NEGATIVE mg/dL   Nitrite NEGATIVE NEGATIVE   Leukocytes, UA 1+ (A) NEGATIVE   RBC / HPF 0-5 0 - 5 RBC/hpf   WBC, UA TOO NUMEROUS TO COUNT 0 - 5 WBC/hpf   Bacteria, UA MANY (A) NONE SEEN   Squamous Epithelial / LPF 6-30 (A) NONE SEEN   Mucous PRESENT    Ca Oxalate Crys, UA PRESENT   CBC  Result Value Ref Range   WBC 12.3 (H) 3.6 - 11.0 K/uL   RBC 3.42 (L) 3.80 - 5.20 MIL/uL   Hemoglobin 10.8 (L) 12.0 - 16.0 g/dL   HCT 81.132.2 (L) 91.435.0 - 78.247.0 %   MCV 94.0 80.0 - 100.0 fL   MCH 31.5 26.0 - 34.0 pg   MCHC 33.5 32.0 - 36.0 g/dL   RDW 95.614.4 21.311.5 - 08.614.5 %   Platelets 409 150 - 440 K/uL  Creatinine, serum  Result Value Ref Range   Creatinine, Ser 0.48 0.44 - 1.00 mg/dL   GFR calc non Af Amer >60 >60 mL/min   GFR calc Af Amer >60 >60 mL/min  Type and screen [facility]  Result Value Ref Range   ABO/RH(D) O POS    Antibody  Screen NEG    Sample Expiration 03/03/2016      Assessment:  1: SIUP at 2849w0d 2. Likely preterm labor 3. Cervical incompetency  Plan:  1. Will attempt to maintain pregnancy as long as possible, however anticipating vaginal delivery sometime today.  2. S/p Neonatology consult. 3. Terbutaline prn for contractions (has received 3 doses).  Will initiate Procardia, 30 mg loading dose, then 20 mg q 6 hrs.  Will continue to monitor for ctx, low BPs 4. S/p Makena injection.  5. Will change to Percocet for moderate pain, Fentanyl for severe pain.  6. Will repeat UA as last UA appears contaminated.    Hildred LaserAnika Allie Gerhold, MD 02/29/2016 6:17 PM

## 2016-03-01 ENCOUNTER — Institutional Professional Consult (permissible substitution): Payer: PRIVATE HEALTH INSURANCE

## 2016-03-01 ENCOUNTER — Encounter: Payer: Self-pay | Admitting: *Deleted

## 2016-03-01 LAB — URINALYSIS COMPLETE WITH MICROSCOPIC (ARMC ONLY)
Bacteria, UA: NONE SEEN
Bilirubin Urine: NEGATIVE
GLUCOSE, UA: NEGATIVE mg/dL
Ketones, ur: NEGATIVE mg/dL
Leukocytes, UA: NEGATIVE
Nitrite: NEGATIVE
PROTEIN: NEGATIVE mg/dL
SQUAMOUS EPITHELIAL / LPF: NONE SEEN
Specific Gravity, Urine: 1.011 (ref 1.005–1.030)
pH: 6 (ref 5.0–8.0)

## 2016-03-01 LAB — CBC
HCT: 31.5 % — ABNORMAL LOW (ref 35.0–47.0)
Hemoglobin: 10.4 g/dL — ABNORMAL LOW (ref 12.0–16.0)
MCH: 31 pg (ref 26.0–34.0)
MCHC: 33.1 g/dL (ref 32.0–36.0)
MCV: 93.8 fL (ref 80.0–100.0)
PLATELETS: 415 10*3/uL (ref 150–440)
RBC: 3.36 MIL/uL — ABNORMAL LOW (ref 3.80–5.20)
RDW: 13.9 % (ref 11.5–14.5)
WBC: 18.5 10*3/uL — AB (ref 3.6–11.0)

## 2016-03-01 LAB — RAPID HIV SCREEN (HIV 1/2 AB+AG)
HIV 1/2 Antibodies: NONREACTIVE
HIV-1 P24 ANTIGEN - HIV24: NONREACTIVE

## 2016-03-01 MED ORDER — IBUPROFEN 600 MG PO TABS: 600.0000 mg | ORAL_TABLET | Freq: Four times a day (QID) | ORAL | Status: DC

## 2016-03-01 MED ORDER — OXYTOCIN 40 UNITS IN LACTATED RINGERS INFUSION - SIMPLE MED
INTRAVENOUS | Status: AC
  Filled 2016-03-01: qty 1000

## 2016-03-01 NOTE — Clinical Social Work Note (Signed)
Events of last evening and this morning noted. CSW contacted patient's nurse this morning, Maxine GlennMonica, to see if I my consult was still needed as it was requested by the patient's mother early yesterday. Patient's nurse was going to call CSW if patient or mother wanted to speak with me. No call received. Patient discharged this afternoon. York SpanielMonica Roselind Klus MSW,LCSW [phone]

## 2016-03-01 NOTE — Progress Notes (Signed)
Post Partum Day # 1, s/p SVD (of nonviable fetus)  Subjective: no complaints, up ad lib, voiding and tolerating PO  Objective: Temp:  [98.1 F (36.7 C)-98.6 F (37 C)] 98.1 F (36.7 C) (06/08 0715) Pulse Rate:  [92-114] 100 (06/08 0715) Resp:  [16-18] 17 (06/08 0715) BP: (114-138)/(42-60) 114/57 mmHg (06/08 0715)  Physical Exam:  General: alert and no distress  Lungs: clear to auscultation bilaterally Breasts: normal appearance, no masses or tenderness Heart: regular rate and rhythm, S1, S2 normal, no murmur, click, rub or gallop Pelvis: Lochia: appropriate, Uterine Fundus: firm Extremities: DVT Evaluation: Negative Homan's sign. No cords or calf tenderness. No significant calf/ankle edema.   Recent Labs  02/29/16 0902 03/01/16 0639  HGB 10.8* 10.4*  HCT 32.2* 31.5*    Assessment/Plan: Discharge home later this evening.  S/p Social Work consult  S/p Chaplain consult Contraception to be discussed postpartum.  Desires to have funeral for infant, patient to arrange.  Declines autopsy.    LOS: 1 day   Hildred LaserAnika Horrace Hanak [facility]

## 2016-03-01 NOTE — Discharge Summary (Signed)
Obstetric Discharge Summary Reason for Admission: onset of labor at [redacted] weeks gestation, h/o cervical insufficiency Prenatal Procedures: ultrasound Intrapartum Procedures: spontaneous vaginal delivery Postpartum Procedures: Rubella Ig and Varivax Complications-Operative and Postpartum: none HEMOGLOBIN  Date Value Ref Range Status  03/01/2016 10.4* 12.0 - 16.0 g/dL Final   HCT  Date Value Ref Range Status  03/01/2016 31.5* 35.0 - 47.0 % Final   HEMATOCRIT  Date Value Ref Range Status  12/07/2015 40.9 34.0 - 46.6 % Final    Physical Exam:  General: alert and no distress Lochia: appropriate Uterine Fundus: firm Incision: none DVT Evaluation: Negative Homan's sign. No cords or calf tenderness. No significant calf/ankle edema.  Discharge Diagnoses: Premature labor  Discharge Information: Date: 03/01/2016 Activity: pelvic rest Diet: routine Medications: PNV and Ibuprofen Condition: stable Instructions: refer to practice specific booklet Discharge to: home Follow-up Information    Follow up with Hildred LaserAnika Shahan Starks, MD In 3 weeks.   Specialties:  Obstetrics and Gynecology, Radiology   Why:  Postpartum visit (S/p 22 week fetal demise)   Contact information:   [address] [place] [id number] [phone]       Newborn Data: Live born female  Birth Weight: 14.4 oz (409 g) APGAR: 1, 1  Fetus to morgue.  Hildred Lasernika Khaleef Ruby 03/01/2016, 8:17 AM

## 2016-03-01 NOTE — Discharge Instructions (Signed)

## 2016-03-01 NOTE — Progress Notes (Signed)
   03/01/16 0853  Clinical Encounter Type  Visited With Patient and family together  Visit Type Initial  Referral From Nurse  Consult/Referral To Chaplain  Spiritual Encounters  Spiritual Needs Grief support  Stress Factors  Patient Stress Factors Loss  Family Stress Factors Loss  Pastoral Care provided to patient and family.

## 2016-03-01 NOTE — Progress Notes (Signed)
   02/29/16 1100  Clinical Encounter Type  Visited With Patient and family together  Visit Type Initial  Referral From Nurse  Consult/Referral To Chaplain  Spiritual Encounters  Spiritual Needs Grief support  Stress Factors  Patient Stress Factors Loss  Family Stress Factors Loss  Pastoral Care for patient and family members.

## 2016-03-02 LAB — URINE CULTURE: SPECIAL REQUESTS: NORMAL

## 2016-03-02 LAB — RPR: RPR: NONREACTIVE

## 2016-03-02 LAB — VARICELLA ZOSTER ANTIBODY, IGG: VARICELLA IGG: 910 {index} (ref >165)

## 2016-03-02 LAB — SURGICAL PATHOLOGY

## 2016-03-04 LAB — AEROBIC/ANAEROBIC CULTURE W GRAM STAIN (SURGICAL/DEEP WOUND)

## 2016-03-07 ENCOUNTER — Encounter: Payer: PRIVATE HEALTH INSURANCE | Admitting: Obstetrics and Gynecology

## 2016-03-21 ENCOUNTER — Encounter: Payer: Self-pay | Admitting: Obstetrics and Gynecology

## 2016-03-21 ENCOUNTER — Ambulatory Visit (INDEPENDENT_AMBULATORY_CARE_PROVIDER_SITE_OTHER): Payer: PRIVATE HEALTH INSURANCE | Admitting: Obstetrics and Gynecology

## 2016-03-21 DIAGNOSIS — Z8759 Personal history of other complications of pregnancy, childbirth and the puerperium: Secondary | ICD-10-CM

## 2016-03-21 DIAGNOSIS — Z8742 Personal history of other diseases of the female genital tract: Secondary | ICD-10-CM

## 2016-03-21 MED ORDER — ETONOGESTREL-ETHINYL ESTRADIOL 0.12-0.015 MG/24HR VA RING: VAGINAL_RING | VAGINAL | Status: AC

## 2016-03-21 NOTE — Progress Notes (Signed)
    GYNECOLOGY PROGRESS NOTE  Subjective:    Patient ID: Connie Holmes, female    DOB: 08/16/1996, 20 y.o.   MRN: [id number]  HPI  Patient is a 20 y.o. [id number] female who presents for follow up 3 weeks after preterm delivery at 22 weeks.  States that she is doing ok.  Has lots of support from friends and family.  Had burial service ~ 2 weeks ago. Notes sadness at times, but denies depression symptoms.  Denies any bleeding or abdominal pain currently.   The following portions of the patient's history were reviewed and updated as appropriate: allergies, current medications, past family history, past medical history, past social history, past surgical history and problem list.  Review of Systems Pertinent items noted in HPI and remainder of comprehensive ROS otherwise negative.   Objective:   Blood pressure 119/72, pulse 73, height 5\' 2"  (1.575 m), weight 180 lb (81.647 kg), not currently breastfeeding. General appearance: alert and no distress Abdomen: soft, non-tender; bowel sounds normal; no masses,  no organomegaly Pelvic: cervix normal in appearance, external genitalia normal, no adnexal masses or tenderness, no cervical motion tenderness, rectovaginal septum normal, uterus normal size, shape, and consistency and vagina normal without discharge Extremities: extremities normal, atraumatic, no cyanosis or edema Neurologic: Grossly normal   Assessment:   S/p non-viable preterm delivery H/o Preterm labor Cervical incompetency  Plan:   - Discussion had with patient regarding future fertility plans.  Patient currently desires to initiate contraception, desires NuvaRing.  Sample given today, inserted during office visit.  Will prescribe.  - Advised that at the time patient does desire to conceive again, she would be a high risk pregnancy and need to initiate [facility] soon as possible.  Will need evaluation for cervical length and possibly a cerclage at beginning of 2nd trimester.  Will also  be a candidate for 17-OHP injections. Patient notes understanding.  - To follow up as needed, or in 1 year.    Hildred LaserAnika Maguire Killmer, MD [facility]

## 2016-05-17 ENCOUNTER — Telehealth: Payer: Self-pay

## 2016-05-17 NOTE — Telephone Encounter (Signed)
Pt left v/m requesting appt for TB skin test. Unable to reach pt; no answer and no v/m set up for contact # left. Have tried multiple times to contact pt. Will try call later.

## 2016-05-17 NOTE — Telephone Encounter (Signed)
No answer and no vm set up. 

## 2016-05-18 NOTE — Telephone Encounter (Signed)
No answer and no v/m has been set up. 

## 2016-07-18 IMAGING — CR DG HAND COMPLETE 3+V*L*
1 series · 3 of 3 positions shown · non-contrast
Comparison: None.

CLINICAL DATA: Injury to ring finger with pain between PIP and DIP
joints. Slammed finger in door.

EXAM:
LEFT HAND - COMPLETE 3+ VIEW

[Series 1: dxr hand lt complete  w/obliques · 0.14mm/px · 3 of 3 slices shown]
[im 1/3]
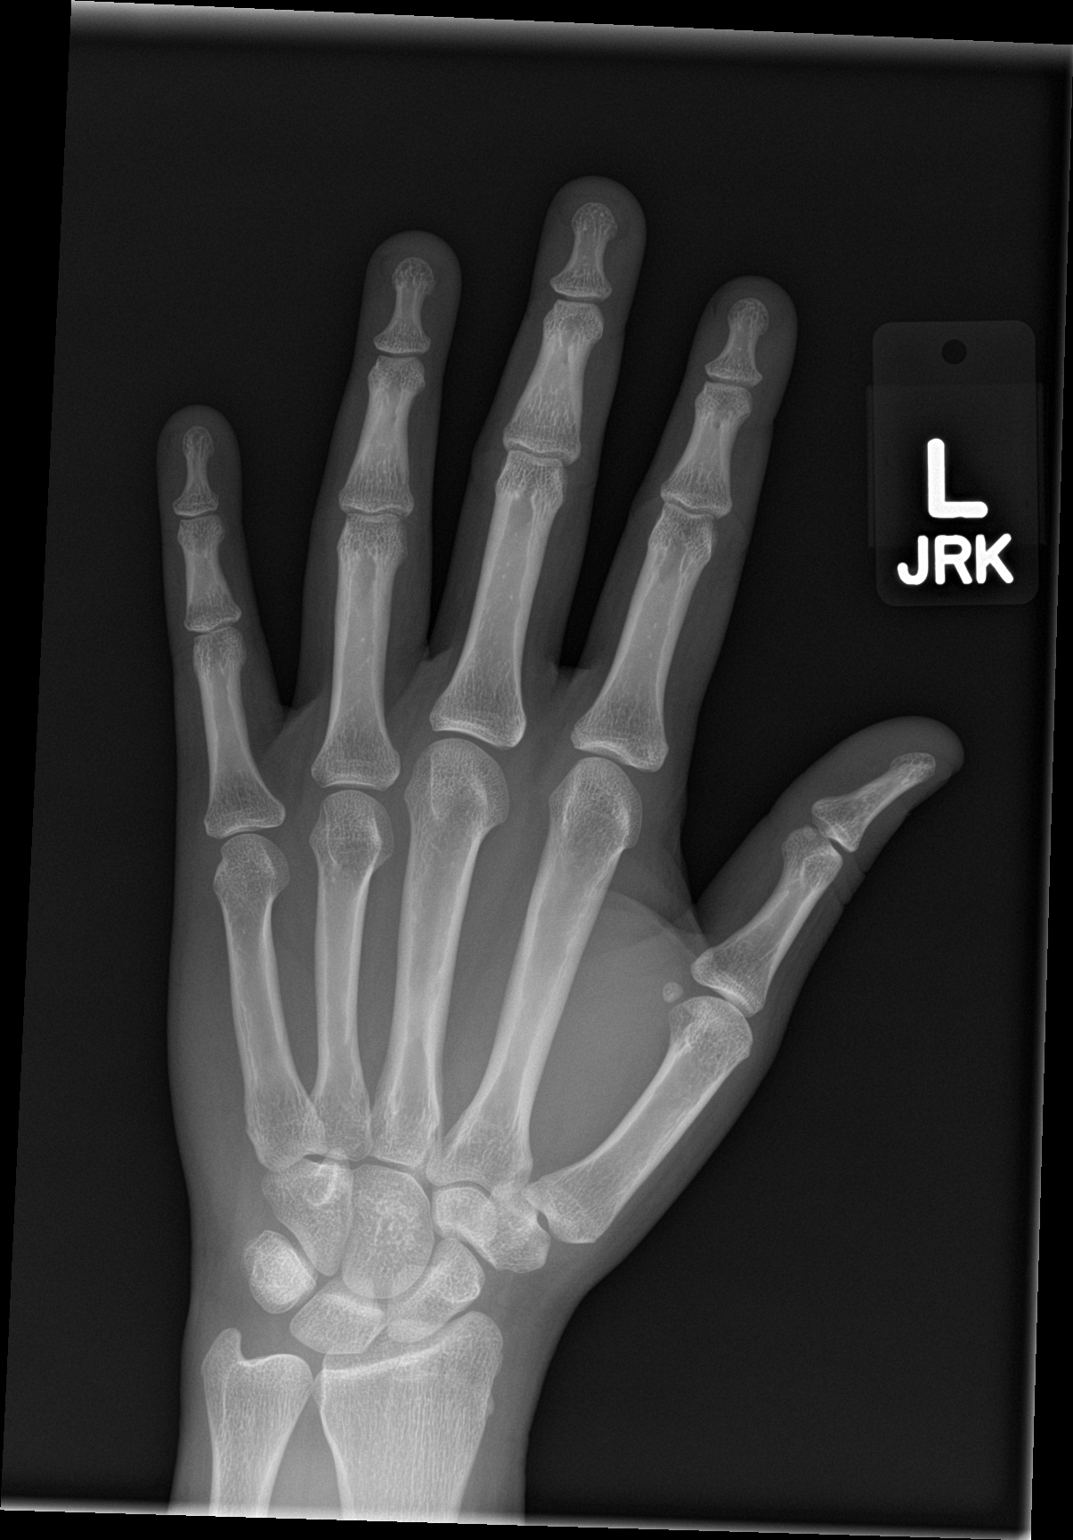
[im 2/3]
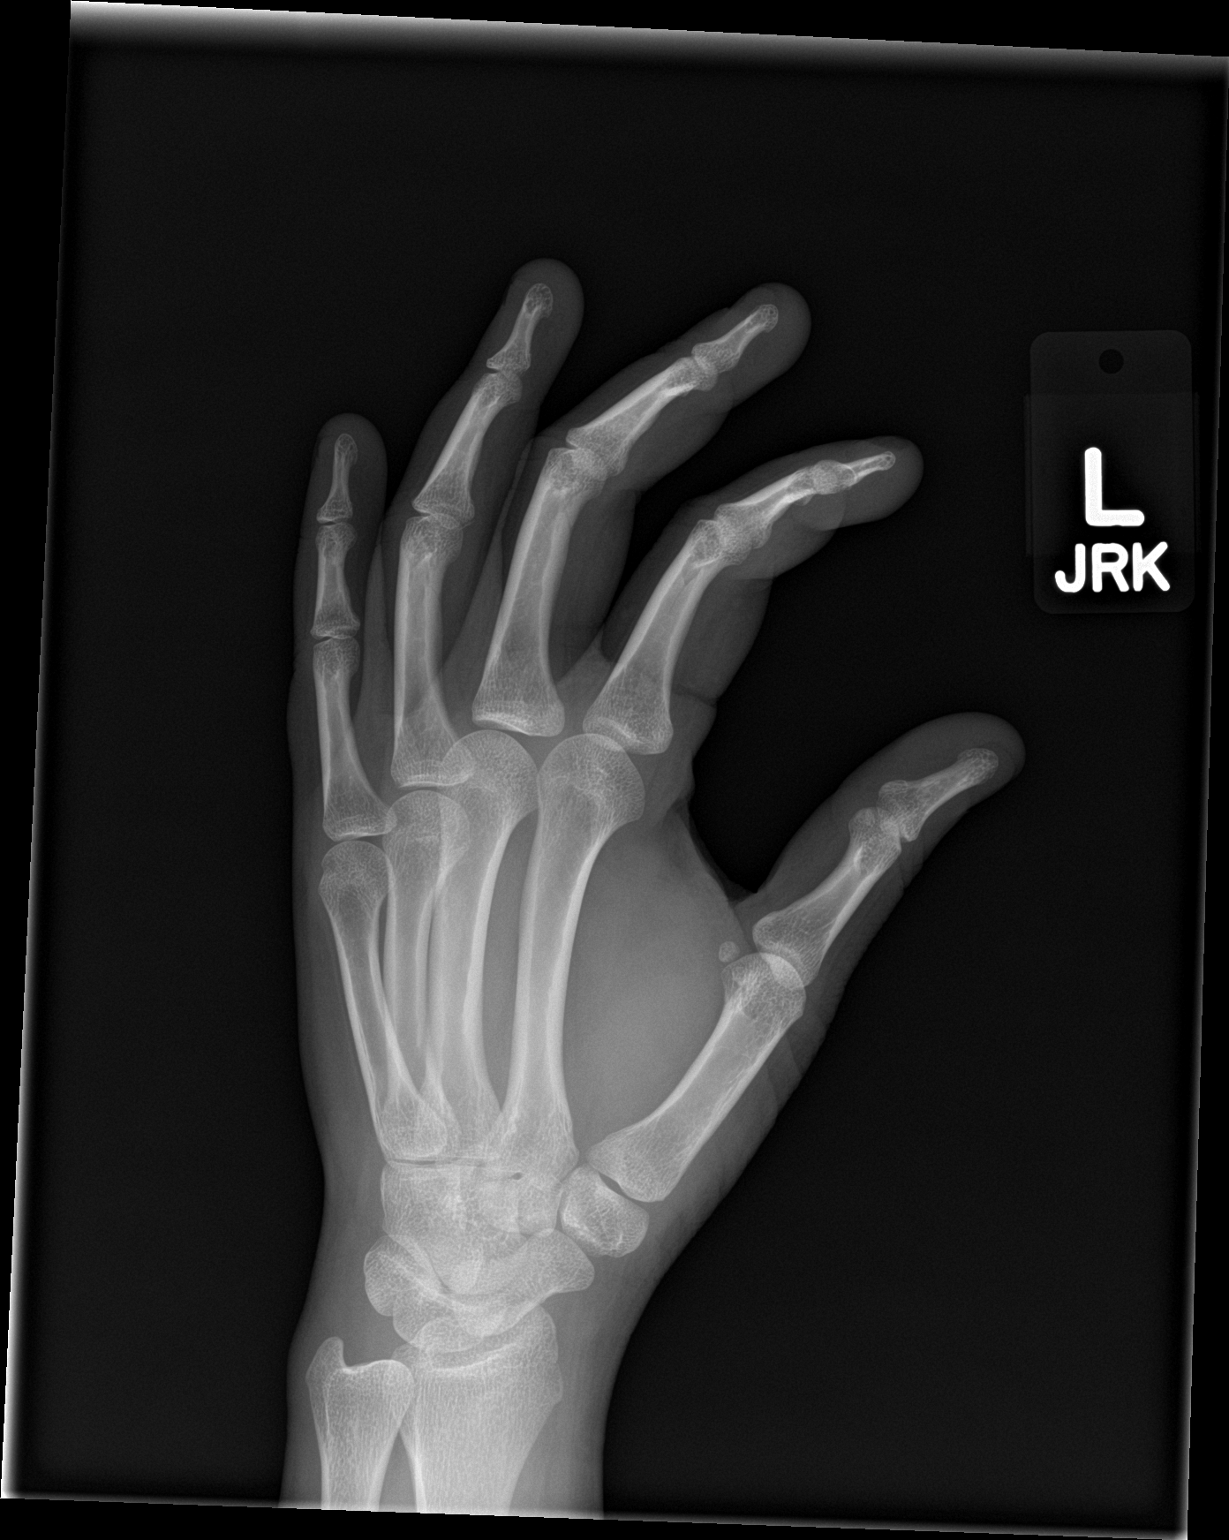
[im 3/3]
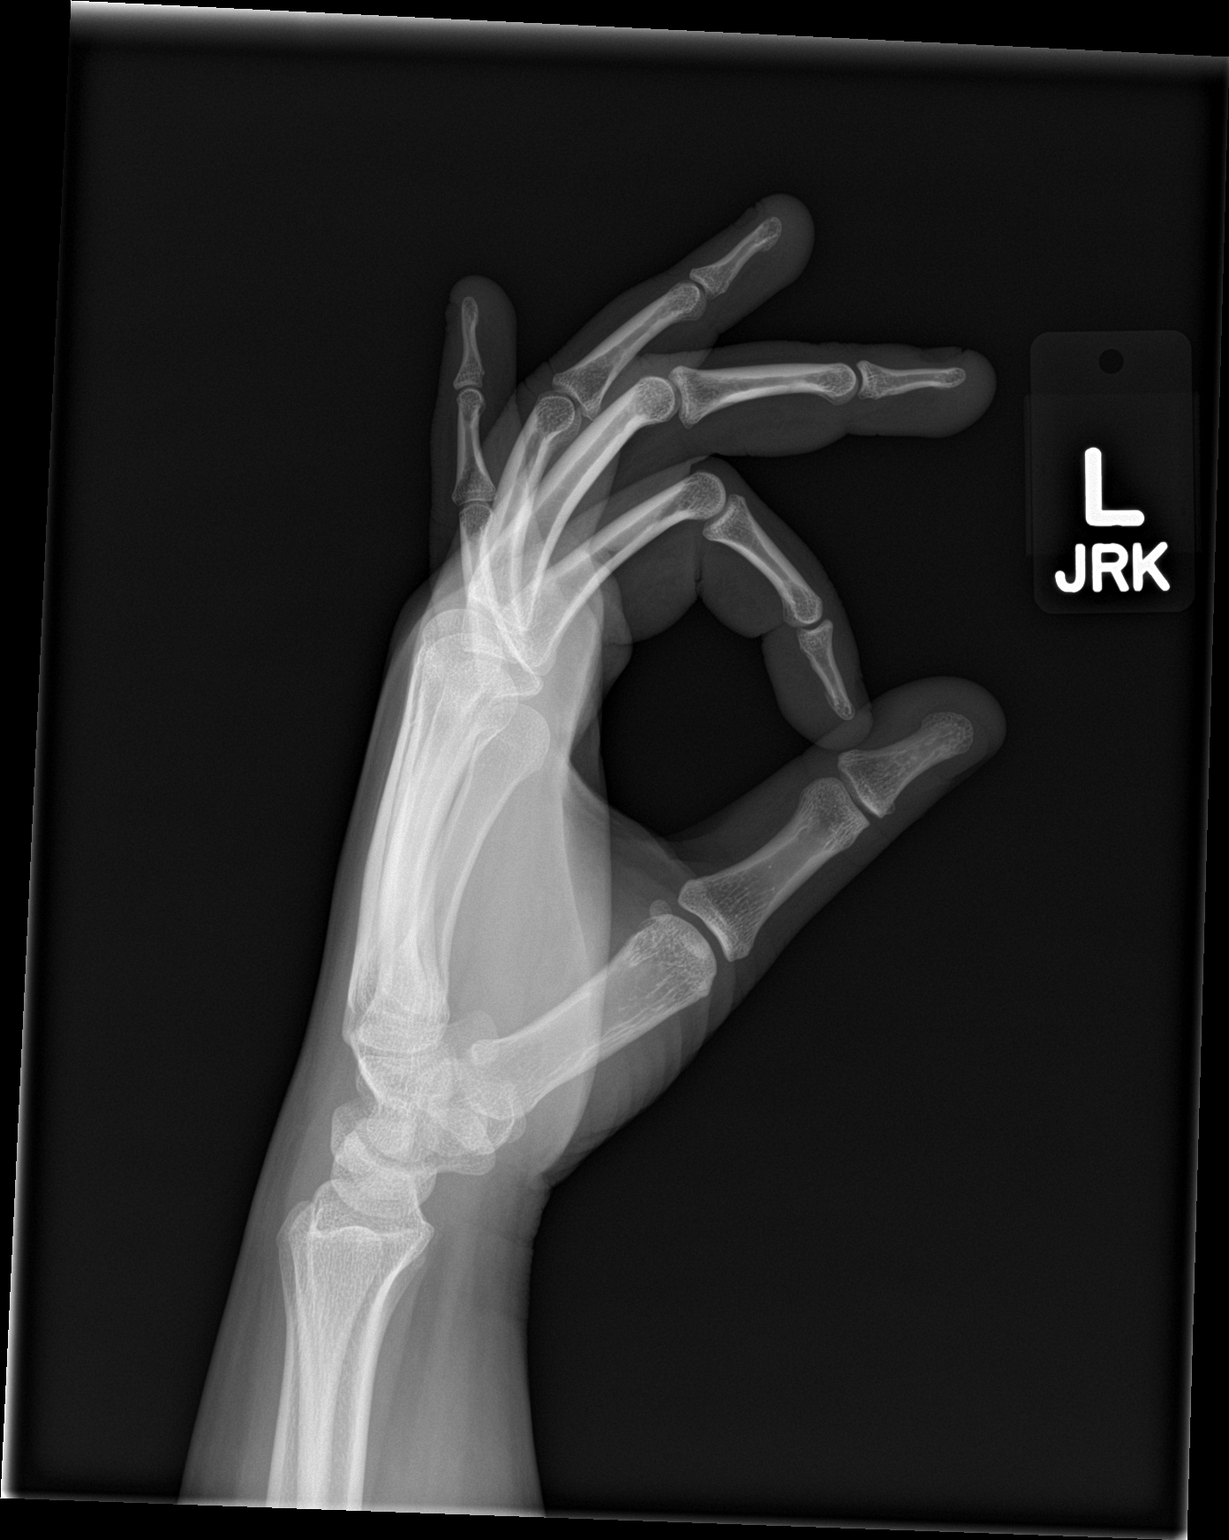

[3 of 3 positions shown; findings below may reference images not displayed]

FINDINGS: There is no evidence of fracture or dislocation. There is no
evidence of arthropathy or other focal bone abnormality. Soft
tissues are unremarkable.
IMPRESSION: Negative.

## 2016-07-25 NOTE — Telephone Encounter (Signed)
Spoke with pt and she does not need appt now; pt will cb if needs anything.

## 2016-08-31 ENCOUNTER — Encounter: Payer: Self-pay | Admitting: Primary Care

## 2016-08-31 ENCOUNTER — Ambulatory Visit: Payer: PRIVATE HEALTH INSURANCE | Admitting: Internal Medicine

## 2016-08-31 ENCOUNTER — Ambulatory Visit (INDEPENDENT_AMBULATORY_CARE_PROVIDER_SITE_OTHER): Payer: PRIVATE HEALTH INSURANCE | Admitting: Primary Care

## 2016-08-31 VITALS — BP 114/70 | HR 100 | Temp 98.3°F | Ht 62.0 in | Wt 192.0 lb

## 2016-08-31 DIAGNOSIS — R350 Frequency of micturition: Secondary | ICD-10-CM

## 2016-08-31 LAB — POC URINALSYSI DIPSTICK (AUTOMATED)
BILIRUBIN UA: NEGATIVE
Glucose, UA: NEGATIVE
KETONES UA: NEGATIVE
Nitrite, UA: NEGATIVE
PH UA: 6
Urobilinogen, UA: NEGATIVE

## 2016-08-31 LAB — POCT URINE PREGNANCY: Preg Test, Ur: NEGATIVE

## 2016-08-31 MED ORDER — SULFAMETHOXAZOLE-TRIMETHOPRIM 800-160 MG PO TABS: 1.0000 | ORAL_TABLET | Freq: Two times a day (BID) | ORAL | 0 refills | Status: DC

## 2016-08-31 NOTE — Addendum Note (Signed)
Addended by: Tawnya CrookSAMBATH, Dewarren Ledbetter on: 08/31/2016 10:35 AM   Modules accepted: Orders

## 2016-08-31 NOTE — Progress Notes (Signed)
Subjective:    Patient ID: Erich MontaneQuantaza Goldner, female    DOB: 01/23/1996, 20 y.o.   MRN: [id number]  HPI  Ms. Delford FieldWright is a 20 year old female who presents today with a chief complaint of nausea. She also reports dizziness and abdominal pain. Her symptoms began with nausea which started on Wednesday evening while at work. She works night shift. She started feeling dizziness with sweats at 4am on Thursday morning which lasted for about 1 hour. She started experiencing mid abdominal pain early Thursday morning while at work. She described her pain as a "tightness".  She started to feel improved 6 pm last night. She denies vomiting, diarrhea, cough, fevers, increased stress. She has noticed urinary frequency since Tuesday afternoon. She denies dysuria, vaginal itching/dicharge, hematuria. LMP November 21st.  Review of Systems  Constitutional: Negative for fatigue and fever.  Respiratory: Negative for shortness of breath.   Cardiovascular: Negative for chest pain.  Gastrointestinal: Positive for nausea. Negative for diarrhea.  Genitourinary: Positive for frequency. Negative for dysuria, flank pain, hematuria and vaginal discharge.  Neurological: Positive for dizziness.       Past Medical History:  Diagnosis Date  . Childhood asthma      Social History   Social History  . Marital status: Single    Spouse name: N/A  . Number of children: N/A  . Years of education: N/A   Occupational History  . cashier [employer]   Social History Main Topics  . Smoking status: Never Smoker  . Smokeless tobacco: Never Used  . Alcohol use No  . Drug use: No  . Sexual activity: Yes    Partners: Male    Birth control/ protection: None   Other Topics Concern  . Not on file   Social History Narrative  . No narrative on file    No past surgical history on file.  Family History  Problem Relation Age of Onset  . Asthma Mother   . Cancer Maternal Grandfather     lung  . Cancer Paternal Grandfather       lung  . Diabetes Paternal Grandmother   . Diabetes Mother   . Hypertension Mother   . Hypertension Maternal Grandfather   . Hypertension Paternal Grandmother   . Hypertension Maternal Grandmother     No Known Allergies  Current Outpatient Prescriptions on File Prior to Visit  Medication Sig Dispense Refill  . etonogestrel-ethinyl estradiol (NUVARING) 0.12-0.015 MG/24HR vaginal ring Insert vaginally and leave in place for 3 consecutive weeks, then remove for 1 week. 1 each 12   No current facility-administered medications on file prior to visit.     BP 114/70   Pulse 100   Temp 98.3 F (36.8 C) (Oral)   Ht 5\' 2"  (1.575 m)   Wt 192 lb (87.1 kg)   SpO2 97%   BMI 35.12 kg/m    Objective:   Physical Exam  Constitutional: She appears well-nourished.  Neck: Neck supple.  Cardiovascular: Normal rate and regular rhythm.   Pulmonary/Chest: Effort normal and breath sounds normal.  Abdominal: Soft. Bowel sounds are normal. There is no tenderness.  Skin: Skin is warm and dry.          Assessment & Plan:  Dizziness/Nausea:  Single episode while at work on Wednesday night/Thursday morning. Symptoms have resolved since last night. Exam today unremarkable. UA: 3+ leuks, trace blood. Culture sent. Urine Pregnancy: Negative Given presence of symptoms and bacteria, will go ahead and treat. Rx for Bactrim DS tablets  sent to pharmacy. Push fluids, rest. Follow up PRN.  Morrie Sheldonlark,Shawndale Kilpatrick Kendal, NP

## 2016-08-31 NOTE — Patient Instructions (Signed)
Start Bactrim DS (sulfamethoxazole/trimethoprim) tablets for urinary tract infection. Take 1 tablet by mouth twice daily for 5 days.  Ensure you are staying hydrated with water and rest.  It was a pleasure meeting you!

## 2016-08-31 NOTE — Addendum Note (Signed)
Addended by: Tawnya CrookSAMBATH, Marijah Larranaga on: 08/31/2016 10:59 AM   Modules accepted: Orders

## 2016-08-31 NOTE — Progress Notes (Signed)
Pre visit review using our clinic review tool, if applicable. No additional management support is needed unless otherwise documented below in the visit note. 

## 2016-09-03 LAB — URINE CULTURE

## 2016-09-13 ENCOUNTER — Telehealth: Payer: Self-pay | Admitting: Internal Medicine

## 2016-09-13 NOTE — Telephone Encounter (Signed)
Patient Name: Connie Holmes DOB: 07/27/1996 Initial Comment Caller states has an abscess between leg and vagina Nurse Assessment Nurse: Yetta BarreJones, RN, Miranda Date/Time (Eastern Time): 09/13/2016 3:21:20 PM Confirm and document reason for call. If symptomatic, describe symptoms. ---Caller states she has an abscess in the crease of her right leg. Does the patient have any new or worsening symptoms? ---Yes Will a triage be completed? ---Yes Related visit to physician within the last 2 weeks? ---No Does the PT have any chronic conditions? (i.e. diabetes, asthma, etc.) ---No Is the patient pregnant or possibly pregnant? (Ask all females between the ages of 4812-55) ---No Is this a behavioral health or substance abuse call? ---No Guidelines Guideline Title Affirmed Question Affirmed Notes Boil (Skin Abscess) [1] Spreading redness around the boil AND [2] no fever Final Disposition User See Physician within 24 Hours Yetta BarreJones, RN, Miranda Comments Appt schedule for tomorrow 12/22 at 3:30pm with Dr. Milinda Antisower. Referrals REFERRED TO PCP OFFICE Disagree/Comply: Comply

## 2016-09-13 NOTE — Telephone Encounter (Signed)
Pt has appt 09/14/16 at 3:30 with Dr Milinda Antisower.

## 2016-09-14 ENCOUNTER — Encounter: Payer: Self-pay | Admitting: Family Medicine

## 2016-09-14 ENCOUNTER — Ambulatory Visit (INDEPENDENT_AMBULATORY_CARE_PROVIDER_SITE_OTHER): Payer: PRIVATE HEALTH INSURANCE | Admitting: Family Medicine

## 2016-09-14 VITALS — BP 110/66 | HR 67 | Temp 98.3°F | Ht 62.0 in | Wt 197.2 lb

## 2016-09-14 DIAGNOSIS — R350 Frequency of micturition: Secondary | ICD-10-CM

## 2016-09-14 DIAGNOSIS — N764 Abscess of vulva: Secondary | ICD-10-CM | POA: Diagnosis not present

## 2016-09-14 MED ORDER — SULFAMETHOXAZOLE-TRIMETHOPRIM 800-160 MG PO TABS: 1.0000 | ORAL_TABLET | Freq: Two times a day (BID) | ORAL | 0 refills | Status: DC

## 2016-09-14 NOTE — Progress Notes (Signed)
Pre visit review using our clinic review tool, if applicable. No additional management support is needed unless otherwise documented below in the visit note. 

## 2016-09-14 NOTE — Progress Notes (Signed)
Subjective:    Patient ID: Connie Holmes, female    DOB: 11/11/1995, 20 y.o.   MRN: [id number]  HPI Here with boil on R leg- crease of her thigh  There for 2 days  Not draining at this point   No fever- feels ok    Hx of recurrent skin infections   No vaginal d/c or symptoms   Last one was 2 weeks ago - got really big -it drained on its own  Was on bactrim for the last one   (also had a uti)  Done with it now   Has had 2 under her arm in the past - a knot - never popped   Patient Active Problem List   Diagnosis Date Noted  . Abscess of right genital labia 09/14/2016  . Short cervical length during pregnancy 02/29/2016  . Labor and delivery, indication for care 02/25/2016  . Short cervix in second trimester, antepartum 02/24/2016  . Supervision of normal first pregnancy in second trimester 12/29/2015  . Rubella non-immune status, antepartum 12/29/2015  . Maternal varicella, non-immune 12/29/2015  . Obesity (BMI 30.0-34.9) 12/29/2015   Past Medical History:  Diagnosis Date  . Childhood asthma    No past surgical history on file. Social History  Substance Use Topics  . Smoking status: Never Smoker  . Smokeless tobacco: Never Used  . Alcohol use No   Family History  Problem Relation Age of Onset  . Asthma Mother   . Cancer Maternal Grandfather     lung  . Cancer Paternal Grandfather     lung  . Diabetes Paternal Grandmother   . Diabetes Mother   . Hypertension Mother   . Hypertension Maternal Grandfather   . Hypertension Paternal Grandmother   . Hypertension Maternal Grandmother    No Known Allergies Current Outpatient Prescriptions on File Prior to Visit  Medication Sig Dispense Refill  . etonogestrel-ethinyl estradiol (NUVARING) 0.12-0.015 MG/24HR vaginal ring Insert vaginally and leave in place for 3 consecutive weeks, then remove for 1 week. 1 each 12   No current facility-administered medications on file prior to visit.     Review of  Systems Review of Systems  Constitutional: Negative for fever, appetite change, fatigue and unexpected weight change.  Eyes: Negative for pain and visual disturbance.  Respiratory: Negative for cough and shortness of breath.   Cardiovascular: Negative for cp or palpitations    Gastrointestinal: Negative for nausea, diarrhea and constipation.  Genitourinary: Negative for urgency and frequency.  Skin: Negative for pallor or rash   pos for swollen labia with tenderness Neurological: Negative for weakness, light-headedness, numbness and headaches.  Hematological: Negative for adenopathy. Does not bruise/bleed easily.  Psychiatric/Behavioral: Negative for dysphoric mood. The patient is not nervous/anxious.         Objective:   Physical Exam  Constitutional: She appears well-developed and well-nourished. No distress.  Well appearing   HENT:  Head: Normocephalic and atraumatic.  Eyes: Conjunctivae and EOM are normal. Pupils are equal, round, and reactive to light. Right eye exhibits no discharge. Left eye exhibits no discharge.  Neck: Normal range of motion. Neck supple.  Cardiovascular: Normal rate and regular rhythm.   Pulmonary/Chest: Effort normal and breath sounds normal.  Genitourinary:  Genitourinary Comments: See skin exam  Lymphadenopathy:    She has no cervical adenopathy.  Neurological: She is alert.  Skin: Skin is warm and dry.  R labia majora- 2-3 cm area of firm tender induration and pink color/ warm to the touch  and no fluctuance No drainage or scabbing No vaginal d/c  Psychiatric: She has a normal mood and affect.          Assessment & Plan:   Problem List Items Addressed This Visit      Genitourinary   Abscess of right genital labia    Area of firm induration and warmth over R labia majora  Not ready for I and D  Suspect pt has hydradenitis given hx of recurrent skin infections tx with bactrim Ds (tolerated this in the past)  Warm compresses whenever  possible Antibacterial soap and water for cleansing (use with caution to avoid internal genital area) inst to alert us if worse pain or swelling or fever or If not starting to improve in several days        Other Visit Diagnoses    Urinary frequency       Relevant Medications   sulfamethoxazole-trimethoprim (BACTRIM DS,SEPTRA DS) 800-160 MG tablet

## 2016-09-14 NOTE — Patient Instructions (Signed)
You have an early abscess (infected cyst or hair follicle) Keep very clean with soap and water  Take the oral bactim twice daily for 10 days Use warm compress or warm bath to encourage healing  Alert us if symptoms worsen or you get a fever  Update if not significantly improving over the weekens

## 2016-09-14 NOTE — Assessment & Plan Note (Signed)
Area of firm induration and warmth over R labia majora  Not ready for I and D  Suspect pt has hydradenitis given hx of recurrent skin infections tx with bactrim Ds (tolerated this in the past)  Warm compresses whenever possible Antibacterial soap and water for cleansing (use with caution to avoid internal genital area) inst to alert us if worse pain or swelling or fever or If not starting to improve in several days

## 2016-09-14 NOTE — Telephone Encounter (Signed)
I will see her then  

## 2017-02-28 ENCOUNTER — Ambulatory Visit (INDEPENDENT_AMBULATORY_CARE_PROVIDER_SITE_OTHER): Payer: PRIVATE HEALTH INSURANCE | Admitting: Family Medicine

## 2017-02-28 ENCOUNTER — Encounter: Payer: Self-pay | Admitting: Family Medicine

## 2017-02-28 VITALS — BP 116/78 | HR 83 | Temp 98.2°F | Wt 208.5 lb

## 2017-02-28 DIAGNOSIS — K148 Other diseases of tongue: Secondary | ICD-10-CM | POA: Diagnosis not present

## 2017-02-28 MED ORDER — NYSTATIN 100000 UNIT/ML MT SUSP: 5.0000 mL | Freq: Three times a day (TID) | OROMUCOSAL | 0 refills | Status: DC

## 2017-02-28 NOTE — Assessment & Plan Note (Addendum)
Actually noted white patches on tongue ?thrush - treat with nystatin swish/swallow. Not consistent with HSV, coxsackie, or geographic tongue.  Update if not improved with treatment.

## 2017-02-28 NOTE — Patient Instructions (Signed)
Possible thrush - treat with nystatin swish/swallow solution three times a day. Avoid spicy acidic foods for now. Let us know if not improving with this.

## 2017-02-28 NOTE — Progress Notes (Signed)
   BP 116/78   Pulse 83   Temp 98.2 F (36.8 C) (Oral)   Wt 208 lb 8 oz (94.6 kg)   LMP 02/22/2017   SpO2 98%   BMI 38.14 kg/m    CC: spots on tongue Subjective:    Patient ID: Connie MontaneQuantaza Copelan, female    DOB: 03/18/1996, 21 y.o.   MRN: [id number]  HPI: Connie MontaneQuantaza Corp is a 21 y.o. female presenting on 02/28/2017 for Spots on tongue (red spots on tongue x 1-2 months.... pt reports her tongue stings with certain drinks and foods)   2 mo h/o red spots on tongue, noticed tender tongue when she has certain foods/drink (spicy foods, lemonade). Feels this may be getting worse. She thinks she was exposed to niece who   No fevers/chills, rashes, congestion, coughing. No abd pain, nausea/vomiting. No swelling of tongue or throat. No dysphagia, sore throat. No significant URI sxs.  Not on any medication at this time. Denies OTC vitamins, supplements.   Relevant past medical, surgical, family and social history reviewed and updated as indicated. Interim medical history since our last visit reviewed. Allergies and medications reviewed and updated. Outpatient Medications Prior to Visit  Medication Sig Dispense Refill  . etonogestrel-ethinyl estradiol (NUVARING) 0.12-0.015 MG/24HR vaginal ring Insert vaginally and leave in place for 3 consecutive weeks, then remove for 1 week. 1 each 12  . sulfamethoxazole-trimethoprim (BACTRIM DS,SEPTRA DS) 800-160 MG tablet Take 1 tablet by mouth 2 (two) times daily. 20 tablet 0   No facility-administered medications prior to visit.      Per HPI unless specifically indicated in ROS section below Review of Systems     Objective:    BP 116/78   Pulse 83   Temp 98.2 F (36.8 C) (Oral)   Wt 208 lb 8 oz (94.6 kg)   LMP 02/22/2017   SpO2 98%   BMI 38.14 kg/m   Wt Readings from Last 3 Encounters:  02/28/17 208 lb 8 oz (94.6 kg)  09/14/16 197 lb 4 oz (89.5 kg) (97 %, Z= 1.90)*  08/31/16 192 lb (87.1 kg) (96 %, Z= 1.81)*   * Growth percentiles are based  on CDC 2-20 Years data.    Physical Exam  Constitutional: She appears well-developed and well-nourished. No distress.  HENT:  Head: Normocephalic and atraumatic.  Mouth/Throat: Oropharynx is clear and moist. No oropharyngeal exudate.  White patches on tongue, unable to be scraped off Small white patch inferior R tongue Normal papillae posterior tongue, some small red papillae anterior dorsal tongue  Eyes: Conjunctivae are normal. Pupils are equal, round, and reactive to light.  Neck: Normal range of motion. Neck supple.  Lymphadenopathy:    She has no cervical adenopathy.  Nursing note and vitals reviewed.     Assessment & Plan:   Problem List Items Addressed This Visit    Tongue discoloration - Primary    Actually noted white patches on tongue ?thrush - treat with nystatin swish/swallow. Not consistent with HSV, coxsackie, or geographic tongue.  Update if not improved with treatment.           Follow up plan: Return if symptoms worsen or fail to improve.  Eustaquio BoydenJavier Carsyn Taubman, MD

## 2017-03-05 ENCOUNTER — Encounter: Payer: Self-pay | Admitting: Internal Medicine

## 2017-03-05 ENCOUNTER — Ambulatory Visit (INDEPENDENT_AMBULATORY_CARE_PROVIDER_SITE_OTHER): Payer: PRIVATE HEALTH INSURANCE | Admitting: Internal Medicine

## 2017-03-05 VITALS — BP 118/82 | HR 85 | Temp 98.6°F | Wt 205.0 lb

## 2017-03-05 DIAGNOSIS — J301 Allergic rhinitis due to pollen: Secondary | ICD-10-CM | POA: Diagnosis not present

## 2017-03-05 NOTE — Patient Instructions (Signed)
Allergic Rhinitis Allergic rhinitis is when the mucous membranes in the nose respond to allergens. Allergens are particles in the air that cause your body to have an allergic reaction. This causes you to release allergic antibodies. Through a chain of events, these eventually cause you to release histamine into the blood stream. Although meant to protect the body, it is this release of histamine that causes your discomfort, such as frequent sneezing, congestion, and an itchy, runny nose. What are the causes? Seasonal allergic rhinitis (hay fever) is caused by pollen allergens that may come from grasses, trees, and weeds. Year-round allergic rhinitis (perennial allergic rhinitis) is caused by allergens such as house dust mites, pet dander, and mold spores. What are the signs or symptoms?  Nasal stuffiness (congestion).  Itchy, runny nose with sneezing and tearing of the eyes. How is this diagnosed? Your health care provider can help you determine the allergen or allergens that trigger your symptoms. If you and your health care provider are unable to determine the allergen, skin or blood testing may be used. Your health care provider will diagnose your condition after taking your health history and performing a physical exam. Your health care provider may assess you for other related conditions, such as asthma, pink eye, or an ear infection. How is this treated? Allergic rhinitis does not have a cure, but it can be controlled by:  Medicines that block allergy symptoms. These may include allergy shots, nasal sprays, and oral antihistamines.  Avoiding the allergen. Hay fever may often be treated with antihistamines in pill or nasal spray forms. Antihistamines block the effects of histamine. There are over-the-counter medicines that may help with nasal congestion and swelling around the eyes. Check with your health care provider before taking or giving this medicine. If avoiding the allergen or the  medicine prescribed do not work, there are many new medicines your health care provider can prescribe. Stronger medicine may be used if initial measures are ineffective. Desensitizing injections can be used if medicine and avoidance does not work. Desensitization is when a patient is given ongoing shots until the body becomes less sensitive to the allergen. Make sure you follow up with your health care provider if problems continue. Follow these instructions at home: It is not possible to completely avoid allergens, but you can reduce your symptoms by taking steps to limit your exposure to them. It helps to know exactly what you are allergic to so that you can avoid your specific triggers. Contact a health care provider if:  You have a fever.  You develop a cough that does not stop easily (persistent).  You have shortness of breath.  You start wheezing.  Symptoms interfere with normal daily activities. This information is not intended to replace advice given to you by your health care provider. Make sure you discuss any questions you have with your health care provider. Document Released: 06/05/2001 Document Revised: 05/11/2016 Document Reviewed: 05/18/2013 Elsevier Interactive Patient Education  2017 Elsevier Inc.  

## 2017-03-05 NOTE — Progress Notes (Signed)
HPI  Pt presents to the clinic today with c/o headache, nasal congestion, sore throat and cough. This started 5 days ago. She is blowing clear mucous out of her nose. She denies difficulty swallowing. The cough is productive of yellow mucous. She denies fever, chills or body aches. She has not tried anything OTC for this. She does not have seasonal allergies that she is aware of. She has not had sick contacts.  Review of Systems     Past Medical History:  Diagnosis Date  . Childhood asthma     Family History  Problem Relation Age of Onset  . Asthma Mother   . Cancer Maternal Grandfather        lung  . Cancer Paternal Grandfather        lung  . Diabetes Paternal Grandmother   . Diabetes Mother   . Hypertension Mother   . Hypertension Maternal Grandfather   . Hypertension Paternal Grandmother   . Hypertension Maternal Grandmother     Social History   Social History  . Marital status: Single    Spouse name: N/A  . Number of children: N/A  . Years of education: N/A   Occupational History  . cashier [employer]   Social History Main Topics  . Smoking status: Never Smoker  . Smokeless tobacco: Never Used  . Alcohol use No  . Drug use: No  . Sexual activity: Yes    Partners: Male    Birth control/ protection: None   Other Topics Concern  . Not on file   Social History Narrative  . No narrative on file    No Known Allergies   Constitutional: Positive headache. Denies fatigue, fever or abrupt weight changes.  HEENT:  Positive nasal congestion and sore throat. Denies eye redness, ear pain, ringing in the ears, wax buildup, runny nose or bloody nose. Respiratory: Positive cough. Denies difficulty breathing or shortness of breath.  Cardiovascular: Denies chest pain, chest tightness, palpitations or swelling in the hands or feet.   No other specific complaints in a complete review of systems (except as listed in HPI above).  Objective:   BP 118/82   Pulse 85   Temp  98.6 F (37 C) (Oral)   Wt 205 lb (93 kg)   LMP 02/22/2017   SpO2 98%   BMI 37.49 kg/m   General: Appears her stated age, in NAD. HEENT: Head: normal shape and size, no sinus tenderness noted Ears: Tm's red and intact, normal light reflex; Nose: mucosa boggy and moist, turbinates swollen; Throat/Mouth: + PND. Teeth present, mucosa pink and moist, no exudate noted, no lesions or ulcerations noted.  Neck:  No adenopathy noted.  Pulmonary/Chest: Normal effort and positive vesicular breath sounds. No respiratory distress. No wheezes, rales or ronchi noted.       Assessment & Plan:   Allergic Rhinitis:  Flonase 2 sprays each nostril for 3 days and then as needed. Start Zyrtec OTC  RTC as needed or if symptoms persist. Nicki ReaperBAITY, REGINA, NP

## 2017-04-27 ENCOUNTER — Emergency Department
Admission: EM | Admit: 2017-04-27 | Discharge: 2017-04-27 | Disposition: A | Discharge status: Home / Self Care | Payer: PRIVATE HEALTH INSURANCE | Attending: Emergency Medicine | Admitting: Emergency Medicine

## 2017-04-27 ENCOUNTER — Encounter: Payer: Self-pay | Admitting: Emergency Medicine

## 2017-04-27 ENCOUNTER — Emergency Department: Payer: PRIVATE HEALTH INSURANCE

## 2017-04-27 DIAGNOSIS — O200 Threatened abortion: Secondary | ICD-10-CM | POA: Diagnosis not present

## 2017-04-27 DIAGNOSIS — Z3A01 Less than 8 weeks gestation of pregnancy: Secondary | ICD-10-CM | POA: Insufficient documentation

## 2017-04-27 DIAGNOSIS — R109 Unspecified abdominal pain: Secondary | ICD-10-CM | POA: Insufficient documentation

## 2017-04-27 DIAGNOSIS — O209 Hemorrhage in early pregnancy, unspecified: Secondary | ICD-10-CM | POA: Diagnosis present

## 2017-04-27 LAB — CBC WITH DIFFERENTIAL/PLATELET
Basophils Absolute: 0 10*3/uL (ref 0–0.1)
Basophils Relative: 1 %
EOS PCT: 3 %
Eosinophils Absolute: 0.2 10*3/uL (ref 0–0.7)
HEMATOCRIT: 39.7 % (ref 35.0–47.0)
Hemoglobin: 13.1 g/dL (ref 12.0–16.0)
LYMPHS ABS: 2.3 10*3/uL (ref 1.0–3.6)
LYMPHS PCT: 38 %
MCH: 30.4 pg (ref 26.0–34.0)
MCHC: 33 g/dL (ref 32.0–36.0)
MCV: 92 fL (ref 80.0–100.0)
Monocytes Absolute: 0.5 10*3/uL (ref 0.2–0.9)
Monocytes Relative: 9 %
Neutro Abs: 3.1 10*3/uL (ref 1.4–6.5)
Neutrophils Relative %: 49 %
PLATELETS: 539 10*3/uL — AB (ref 150–440)
RBC: 4.31 MIL/uL (ref 3.80–5.20)
RDW: 15.6 % — ABNORMAL HIGH (ref 11.5–14.5)
WBC: 6.1 10*3/uL (ref 3.6–11.0)

## 2017-04-27 LAB — URINALYSIS, COMPLETE (UACMP) WITH MICROSCOPIC
BILIRUBIN URINE: NEGATIVE
Glucose, UA: NEGATIVE mg/dL
KETONES UR: NEGATIVE mg/dL
Nitrite: NEGATIVE
PH: 5 (ref 5.0–8.0)
PROTEIN: NEGATIVE mg/dL
Specific Gravity, Urine: 1.015 (ref 1.005–1.030)

## 2017-04-27 LAB — HCG, QUANTITATIVE, PREGNANCY: hCG, Beta Chain, Quant, S: 17 m[IU]/mL — ABNORMAL HIGH (ref <5)

## 2017-04-27 LAB — BASIC METABOLIC PANEL
Anion gap: 7 (ref 5–15)
BUN: 8 mg/dL (ref 6–20)
CO2: 23 mmol/L (ref 22–32)
Calcium: 8.9 mg/dL (ref 8.9–10.3)
Chloride: 108 mmol/L (ref 101–111)
Creatinine, Ser: 0.88 mg/dL (ref 0.44–1.00)
GFR calc Af Amer: >60 mL/min (ref >60)
GLUCOSE: 101 mg/dL — AB (ref 65–99)
POTASSIUM: 3.8 mmol/L (ref 3.5–5.1)
Sodium: 138 mmol/L (ref 135–145)

## 2017-04-27 LAB — PREGNANCY, URINE: PREG TEST UR: NEGATIVE

## 2017-04-27 LAB — ABO/RH: ABO/RH(D): O POS

## 2017-04-27 NOTE — ED Provider Notes (Signed)
[facility] Emergency Department Provider Note       Time seen: ----------------------------------------- 11:05 AM on 04/27/2017 -----------------------------------------     I have reviewed the triage vital signs and the nursing notes.   HISTORY   Chief Complaint Vaginal Bleeding    HPI Connie Holmes is a 21 y.o. female who presents to the ED for abdominal pain with mild vaginal bleeding. Patient reports she is having some bleeding but it is less than a menstrual bleed and she has had some cramping as well. Symptoms started last 24 hours. Patient is G2 P0 Ab1. She denies any other complaints.   Past Medical History:  Diagnosis Date  . Childhood asthma     There are no active problems to display for this patient.   History reviewed. No pertinent surgical history.  Allergies Patient has no known allergies.  Social History Social History  Substance Use Topics  . Smoking status: Never Smoker  . Smokeless tobacco: Never Used  . Alcohol use No    Review of Systems Constitutional: Negative for fever. Eyes: Negative for vision changes ENT:  Negative for congestion, sore throat Cardiovascular: Negative for chest pain. Respiratory: Negative for shortness of breath. Gastrointestinal: Negative for abdominal pain, vomiting and diarrhea. Genitourinary: Negative for dysuria.Positive for vaginal bleeding and cramping Musculoskeletal: Negative for back pain. Skin: Negative for rash. Neurological: Negative for headaches, focal weakness or numbness.  All systems negative/normal/unremarkable except as stated in the HPI  ____________________________________________   PHYSICAL EXAM:  VITAL SIGNS: ED Triage Vitals  Enc Vitals Group     BP 04/27/17 1057 (!) 148/67     Pulse Rate 04/27/17 1057 86     Resp 04/27/17 1057 20     Temp 04/27/17 1057 99.7 F (37.6 C)     Temp Source 04/27/17 1057 Oral     SpO2 04/27/17 1057 98 %     Weight  04/27/17 1057 204 lb (92.5 kg)     Height 04/27/17 1057 5\' 2"  (1.575 m)     Head Circumference --      Peak Flow --      Pain Score 04/27/17 1102 3     Pain Loc --      Pain Edu? --      Excl. in GC? --     Constitutional: Alert and oriented. Well appearing and in no distress. Eyes: Conjunctivae are normal. Normal extraocular movements. ENT   Head: Normocephalic and atraumatic.   Nose: No congestion/rhinnorhea.   Mouth/Throat: Mucous membranes are moist.   Neck: No stridor. Cardiovascular: Normal rate, regular rhythm. No murmurs, rubs, or gallops. Respiratory: Normal respiratory effort without tachypnea nor retractions. Breath sounds are clear and equal bilaterally. No wheezes/rales/rhonchi. Gastrointestinal: Soft and nontender. Normal bowel sounds Musculoskeletal: Nontender with normal range of motion in extremities. No lower extremity tenderness nor edema. Neurologic:  Normal speech and language. No gross focal neurologic deficits are appreciated.  Skin:  Skin is warm, dry and intact. No rash noted. Psychiatric: Mood and affect are normal. Speech and behavior are normal.   ____________________________________________  ED COURSE:  Pertinent labs & imaging results that were available during my care of the patient were reviewed by me and considered in my medical decision making (see chart for details). Patient presents for vaginal bleeding and possible pregnancy, we will assess with labs and imaging as indicated.   Procedures ____________________________________________   LABS (pertinent positives/negatives)  Labs Reviewed  HCG, QUANTITATIVE, PREGNANCY - Abnormal; Notable for the following:  Result Value   hCG, Beta Chain, Quant, S 17 (*)    All other components within normal limits  CBC WITH DIFFERENTIAL/PLATELET - Abnormal; Notable for the following:    RDW 15.6 (*)    Platelets 539 (*)    All other components within normal limits  BASIC METABOLIC PANEL  - Abnormal; Notable for the following:    Glucose, Bld 101 (*)    All other components within normal limits  URINALYSIS, COMPLETE (UACMP) WITH MICROSCOPIC - Abnormal; Notable for the following:    Color, Urine YELLOW (*)    APPearance HAZY (*)    Hgb urine dipstick SMALL (*)    Leukocytes, UA MODERATE (*)    Bacteria, UA RARE (*)    Squamous Epithelial / LPF 0-5 (*)    All other components within normal limits  PREGNANCY, URINE  ABO/RH    RADIOLOGY  Pregnancy ultrasound IMPRESSION: 1. No intrauterine pregnancy identified. 2. No evidence of ectopic pregnancy seen. No mass or free fluid within either adnexal region. 3. Small amount of fluid/debris within the endometrial canal and endocervical canal. Recommend follow-up with serial beta HCG levels, and pelvic ultrasound as needed, to exclude early/occult intrauterine or ectopic pregnancy. ____________________________________________  FINAL ASSESSMENT AND PLAN  Threatened miscarriage  Plan: Patient's labs and imaging were dictated above. Patient had presented for Cramping and bleeding in pregnancy. This is likely a miscarriage in process. At best she is in her first week of pregnancy. I will advise follow-up in 2 days for repeat hCG check.   Emily FilbertWilliams, Jonathan E, MD   Note: This note was generated in part or whole with voice recognition software. Voice recognition is usually quite accurate but there are transcription errors that can and very often do occur. I apologize for any typographical errors that were not detected and corrected.     Emily FilbertWilliams, Jonathan E, MD 04/27/17 1310

## 2017-04-27 NOTE — ED Triage Notes (Signed)
Pt to ed with c/o abd pain and mild vaginal bleeding.

## 2017-04-27 NOTE — ED Notes (Signed)

## 2017-04-27 NOTE — ED Notes (Signed)
Patient transported to Ultrasound 

## 2017-04-28 LAB — URINE CULTURE: Special Requests: NORMAL

## 2017-05-01 ENCOUNTER — Encounter: Payer: Self-pay | Admitting: Emergency Medicine

## 2017-05-01 ENCOUNTER — Emergency Department
Admission: EM | Admit: 2017-05-01 | Discharge: 2017-05-01 | Disposition: A | Discharge status: Home / Self Care | Payer: Medicaid Other | Attending: Emergency Medicine | Admitting: Emergency Medicine

## 2017-05-01 DIAGNOSIS — Z79899 Other long term (current) drug therapy: Secondary | ICD-10-CM | POA: Insufficient documentation

## 2017-05-01 DIAGNOSIS — O209 Hemorrhage in early pregnancy, unspecified: Secondary | ICD-10-CM | POA: Diagnosis present

## 2017-05-01 DIAGNOSIS — O039 Complete or unspecified spontaneous abortion without complication: Secondary | ICD-10-CM | POA: Diagnosis not present

## 2017-05-01 LAB — CBC
HEMATOCRIT: 38.9 % (ref 35.0–47.0)
Hemoglobin: 13.1 g/dL (ref 12.0–16.0)
MCH: 31.4 pg (ref 26.0–34.0)
MCHC: 33.6 g/dL (ref 32.0–36.0)
MCV: 93.4 fL (ref 80.0–100.0)
PLATELETS: 466 10*3/uL — AB (ref 150–440)
RBC: 4.16 MIL/uL (ref 3.80–5.20)
RDW: 15.2 % — AB (ref 11.5–14.5)
WBC: 5.1 10*3/uL (ref 3.6–11.0)

## 2017-05-01 LAB — BASIC METABOLIC PANEL
Anion gap: 8 (ref 5–15)
BUN: 7 mg/dL (ref 6–20)
CALCIUM: 9.2 mg/dL (ref 8.9–10.3)
CO2: 25 mmol/L (ref 22–32)
Chloride: 106 mmol/L (ref 101–111)
Creatinine, Ser: 0.75 mg/dL (ref 0.44–1.00)
GFR calc Af Amer: >60 mL/min (ref >60)
GFR calc non Af Amer: >60 mL/min (ref >60)
Glucose, Bld: 92 mg/dL (ref 65–99)
Potassium: 4 mmol/L (ref 3.5–5.1)
Sodium: 139 mmol/L (ref 135–145)

## 2017-05-01 LAB — HCG, QUANTITATIVE, PREGNANCY: HCG, BETA CHAIN, QUANT, S: 2 m[IU]/mL (ref <5)

## 2017-05-01 NOTE — ED Triage Notes (Signed)
Seen through ED on Saturday and + Q HCG.  Patient had US done and told to return Monday to ED to recheck Q HCG.  Patient unable to return on Monday due to working.  Arrives today to recheck blood level.  States vaginal bleeding continues.

## 2017-05-01 NOTE — ED Provider Notes (Signed)
[facility] Emergency Department Provider Note  Time seen: 10:00 PM  I have reviewed the triage vital signs and the nursing notes.   HISTORY  Chief Complaint Vaginal Bleeding    HPI Connie Holmes is a 21 y.o. female with no past medical history who presents to the emergency department for recheck of her pregnancy hormone level. According to the patient she took a pregnancy test a little over one week ago she was late on her period. It was positive. She states approximate one week ago she began with vaginal bleeding. She went to the emergency department 04/27/17 at that time had a very low brings her hormone level and an ultrasound that showed no pregnancy. She was told to come back in 2 days for recheck for Percocet normal level but the patient states she has been working and today was her first day off that she could come to the ER. Patient denies any abdominal pain, states she continues to have vaginal bleeding she states is about the same as a normal menstrual period. Denies passing any tissue denies any fever. Denies dysuria.  Past Medical History:  Diagnosis Date  . Childhood asthma     There are no active problems to display for this patient.   History reviewed. No pertinent surgical history.  Prior to Admission medications   Medication Sig Start Date End Date Taking? Authorizing Provider  etonogestrel-ethinyl estradiol (NUVARING) 0.12-0.015 MG/24HR vaginal ring Insert vaginally and leave in place for 3 consecutive weeks, then remove for 1 week. 03/21/16   Hildred Laser, MD  Multiple Vitamins-Minerals (CENTRUM ADULTS PO) Take 1 tablet by mouth.    [provider]  nystatin (MYCOSTATIN) 100000 UNIT/ML suspension Take 5 mLs (500,000 Units total) by mouth 3 (three) times daily. 02/28/17   Eustaquio Boyden, MD    No Known Allergies  Family History  Problem Relation Age of Onset  . Asthma Mother   . Diabetes Mother   . Hypertension Mother   .  Hypertension Maternal Grandmother   . Cancer Maternal Grandfather        lung  . Hypertension Maternal Grandfather   . Diabetes Paternal Grandmother   . Hypertension Paternal Grandmother   . Cancer Paternal Grandfather        lung    Social History Social History  Substance Use Topics  . Smoking status: Never Smoker  . Smokeless tobacco: Never Used  . Alcohol use No    Review of Systems Constitutional: Negative for fever. Cardiovascular: Negative for chest pain. Respiratory: Negative for shortness of breath. GU: Positive for vaginal bleeding Gastrointestinal: Negative for abdominal pain Genitourinary: Negative for dysuria. Neurological: Negative for headache All other ROS negative  ____________________________________________   PHYSICAL EXAM:  VITAL SIGNS: ED Triage Vitals  Enc Vitals Group     BP 05/01/17 1851 (!) 142/71     Pulse Rate 05/01/17 1851 78     Resp 05/01/17 1851 16     Temp 05/01/17 1851 98.8 F (37.1 C)     Temp Source 05/01/17 1851 Oral     SpO2 05/01/17 1851 100 %     Weight 05/01/17 1847 204 lb (92.5 kg)     Height 05/01/17 1847 5\' 2"  (1.575 m)     Head Circumference --      Peak Flow --      Pain Score --      Pain Loc --      Pain Edu? --      Excl.  in GC? --     Constitutional: Alert and oriented. Well appearing and in no distress. Eyes: Normal exam ENT   Head: Normocephalic and atraumatic.   Mouth/Throat: Mucous membranes are moist. Cardiovascular: Normal rate, regular rhythm. No murmur Respiratory: Normal respiratory effort without tachypnea nor retractions. Breath sounds are clear  Gastrointestinal: Soft and nontender. No distention Musculoskeletal: Nontender with normal range of motion in all extremities. Neurologic:  Normal speech and language. No gross focal neurologic deficits  Skin:  Skin is warm, dry and intact.  Psychiatric: Mood and affect are normal.   ____________________________________________    INITIAL  IMPRESSION / ASSESSMENT AND PLAN / ED COURSE  Pertinent labs & imaging results that were available during my care of the patient were reviewed by me and considered in my medical decision making (see chart for details).  Patient's repeat beta hCG today is 2. Patient ultrasound on 04/27/17 SAW NO GESTATIONAL SAC OR SIGN OF INTRAUTERINE PREGNANCY. GIVEN HER DECLINING BETA-HCG WITH A NORMAL ULTRASOUND 04/27/17 ARE HIGHLY SUSPECT MISCARRIAGE. I discussed with the patient taking a urine pregnancy test in 1 week to ensure that it remains negative. Patient will follow up with her primary care doctor. Patient agreeable to plan. Patient has a completely normal physical exam at this time with a nontender abdomen.  ____________________________________________   FINAL CLINICAL IMPRESSION(S) / ED DIAGNOSES  Complete miscarriage    Minna AntisPaduchowski, Mikhi Athey, MD 05/01/17 2203

## 2017-08-07 ENCOUNTER — Encounter: Payer: Self-pay | Admitting: Family Medicine

## 2017-08-07 ENCOUNTER — Ambulatory Visit (INDEPENDENT_AMBULATORY_CARE_PROVIDER_SITE_OTHER): Payer: PRIVATE HEALTH INSURANCE | Admitting: Family Medicine

## 2017-08-07 VITALS — BP 130/70 | HR 89 | Temp 98.2°F | Wt 220.5 lb

## 2017-08-07 DIAGNOSIS — B370 Candidal stomatitis: Secondary | ICD-10-CM

## 2017-08-07 DIAGNOSIS — L749 Eccrine sweat disorder, unspecified: Secondary | ICD-10-CM | POA: Diagnosis not present

## 2017-08-07 DIAGNOSIS — R002 Palpitations: Secondary | ICD-10-CM

## 2017-08-07 DIAGNOSIS — R232 Flushing: Secondary | ICD-10-CM | POA: Diagnosis not present

## 2017-08-07 DIAGNOSIS — J029 Acute pharyngitis, unspecified: Secondary | ICD-10-CM

## 2017-08-07 LAB — COMPREHENSIVE METABOLIC PANEL
ALK PHOS: 66 U/L (ref 39–117)
ALT: 18 U/L (ref 0–35)
AST: 19 U/L (ref 0–37)
Albumin: 4.1 g/dL (ref 3.5–5.2)
BILIRUBIN TOTAL: 0.2 mg/dL (ref 0.2–1.2)
BUN: 10 mg/dL (ref 6–23)
CO2: 27 mEq/L (ref 19–32)
Calcium: 9.5 mg/dL (ref 8.4–10.5)
Chloride: 105 mEq/L (ref 96–112)
Creatinine, Ser: 0.84 mg/dL (ref 0.40–1.20)
GFR: 110.23 mL/min (ref >60.00)
GLUCOSE: 99 mg/dL (ref 70–99)
Potassium: 4.5 mEq/L (ref 3.5–5.1)
SODIUM: 138 meq/L (ref 135–145)
TOTAL PROTEIN: 7.3 g/dL (ref 6.0–8.3)

## 2017-08-07 LAB — HEMOGLOBIN A1C: Hgb A1c MFr Bld: 5.8 % (ref 4.6–6.5)

## 2017-08-07 LAB — TSH: TSH: 1.35 u[IU]/mL (ref 0.35–5.50)

## 2017-08-07 LAB — POCT RAPID STREP A (OFFICE): Rapid Strep A Screen: NEGATIVE

## 2017-08-07 MED ORDER — CLOTRIMAZOLE 10 MG MT TROC: 10.0000 mg | Freq: Three times a day (TID) | OROMUCOSAL | 0 refills | Status: AC

## 2017-08-07 NOTE — Patient Instructions (Addendum)
Your strep test was negative  Please stop at the lab, we will notify you of results  If symptoms not improved, please follow up with Rene Kocheregina  I have sent a dissolvable tablet for your tongue to your pharmacy, please use 3 times a day until gone

## 2017-08-07 NOTE — Progress Notes (Signed)
Subjective:    Patient ID: Connie Holmes, female    DOB: 07/10/1996, 21 y.o.   MRN: [id number]  HPI This is a 21 yo female who presents today with intermittent hot flashes and palpitations x 1 week. Episodes last for about 2 minutes and she is having them several times a day. Hot flashes happen only at work, palpitations can happen at home. No new medications. No fever, no chest pain, feels mildly SOB and with climbing stairs and exertion.  She attributes this to recent weight gain. She currently lives at home and has decided not to go back to school next semester. She is trying to get her own apartment. Has been nauseous a lot, a little bit before other symptoms started. No vomiting. Nausea comes and goes. Sleeping well. Feels rested.   In August she had an unintended pregnancy with her current boyfriend.  She had a spontaneous miscarriage.  She states that she is handling this well and is not thinking about it much.  She admits to being under a great deal of stress with conflict with her parents regarding going back to school.  She has been trying to work as much a pop as possible to be able to afford to move out.  She states that all she does is go to work and eat.  She rarely cooks, eating mostly fast food.  Sleeping okay.  She also has concerned that she has continued discoloration of her tongue.  She was seen June 2018.  She had a few white patches on her tongue that were not scrappable.  She was treated with nystatin swish and swallow with a little improvement.  No pain.  She has had a sore throat for the last couple of days.  No fever.  No cough, no runny nose, no ear pain.  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 Childhood asthma    No past surgical history on file. Family History  Problem Relation Age of Onset  . Asthma Mother   . Diabetes  Mother   . Hypertension Mother   . Hypertension Maternal Grandmother   . Cancer Maternal Grandfather        lung  . Hypertension Maternal Grandfather   . Diabetes Paternal Grandmother   . Hypertension Paternal Grandmother   . Cancer Paternal Grandfather        lung   Social History   Tobacco Use  . Smoking status: Never Smoker  . Smokeless tobacco: Never Used  Substance Use Topics  . Alcohol use: No    Alcohol/week: 0.0 oz  . Drug use: No      Review of Systems Per HPI    Objective:   Physical Exam  Constitutional: She is oriented to person, place, and time. She appears well-developed and well-nourished. No distress.  Morbidly obese  HENT:  Head: Normocephalic and atraumatic.  Tongue with small amount of thin white coating.  Eyes: Conjunctivae are normal.  Neck: Normal range of motion. Neck supple.  Cardiovascular: Normal rate, regular rhythm and normal heart sounds.  Pulmonary/Chest: Effort normal and breath sounds normal.  Musculoskeletal: She exhibits no edema.  Lymphadenopathy:    She has no cervical adenopathy.  Neurological: She is alert and oriented to person, place, and time.  Skin: Skin is warm and dry. She is not diaphoretic.  Psychiatric: She  has a normal mood and affect. Her behavior is normal. Judgment and thought content normal.  Vitals reviewed.     BP 130/70 (BP Location: Left Arm, Patient Position: Sitting, Cuff Size: Large)   Pulse 89   Temp 98.2 F (36.8 C) (Oral)   Wt 220 lb 8 oz (100 kg)   LMP 07/18/2017   SpO2 98%   Breastfeeding? Unknown   BMI 40.33 kg/m  Wt Readings from Last 3 Encounters:  08/07/17 220 lb 8 oz (100 kg)  05/01/17 204 lb (92.5 kg)  04/27/17 204 lb (92.5 kg)   And    Assessment & Plan:  1. Sore throat - POCT rapid strep A- negative - likely viral, recommended OTC analgesics, warm salt water gargles  2. Palpitations -None present today in the office, did not think it was worthwhile to get an EKG as she was  asymptomatic -Given her recent stroke multiple stressors is likely anxiety is playing a role in her palpitations and sweating, encouraged her to talk with a counselor, work on self-care-good sleep, eating healthy foods, getting some exercise. -We will check labs and if symptoms continue can order a Holter monitor - Hemoglobin A1c - TSH - Comprehensive metabolic panel  3. Sweating abnormality - Hemoglobin A1c - TSH - Comprehensive metabolic panel  4. Flushing - Hemoglobin A1c - TSH - Comprehensive metabolic panel  5. Oral candida - clotrimazole (MYCELEX) 10 MG troche; Take 1 tablet (10 mg total) 3 (three) times daily by mouth.  Dispense: 21 tablet; Refill: 0  -Follow-up with PCP if no improvement in symptoms  Olean Reeeborah Coco Sharpnack, FNP-BC  [facility]  08/11/2017 4:13 PM

## 2017-08-11 ENCOUNTER — Encounter: Payer: Self-pay | Admitting: Family Medicine

## 2017-08-20 ENCOUNTER — Telehealth: Payer: Self-pay | Admitting: Internal Medicine

## 2017-08-20 NOTE — Telephone Encounter (Signed)
Pt called in for lab results.   Results given:  Her electrolytes, liver function and kidney function and thyroid tests were normal.  Her blood sugar is in the prediabetic range and I recommend she avoid soda, fast food and processed foods.  If she continues to have symtoms please follow-up with Nicki Reaperegina Baity. Pt had no further questions or concerns.

## 2018-01-12 IMAGING — US US OB FOLLOW-UP
1 series · 14 of 28 positions shown · non-contrast
Comparison: none

CLINICAL DATA: Short cervical length.

EXAM:
LIMITED OBSTETRIC ULTRASOUND AND TRANSVAGINAL OBSTETRIC ULTRASOUND

[Series 1: us ob follow-up · 0.25mm/px · 82 acquisitions, 14 frames shown]
[im 4/82]
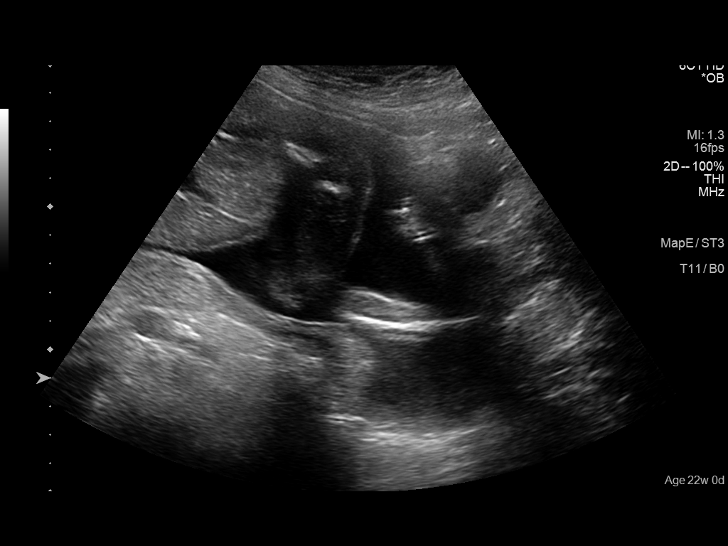
[im 10/82]
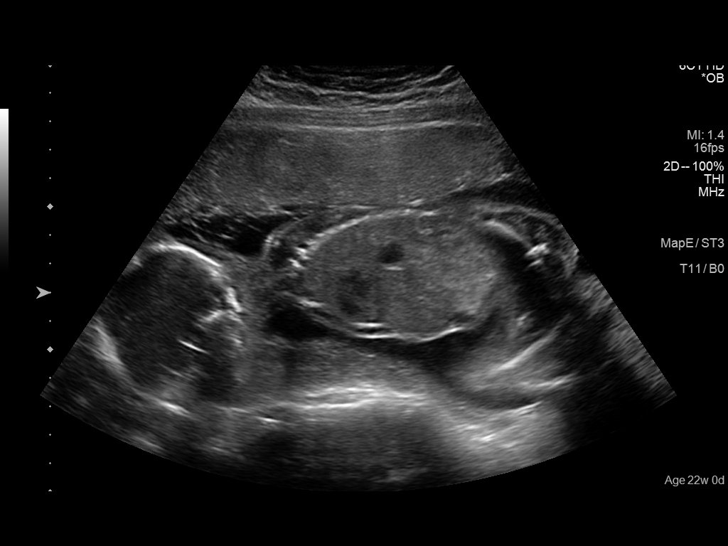
[im 16/82]
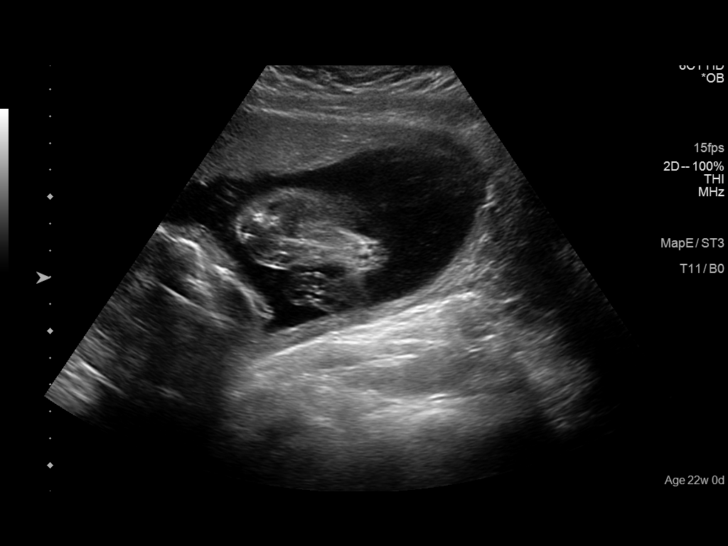
[im 22/82]
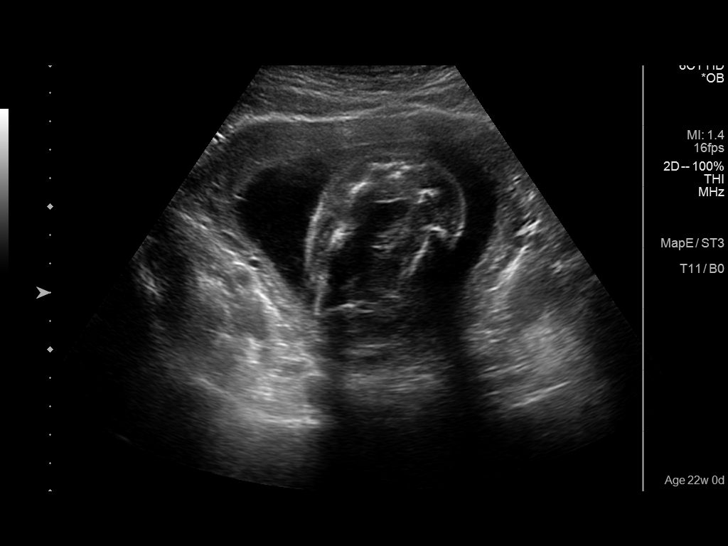
[im 28/82]
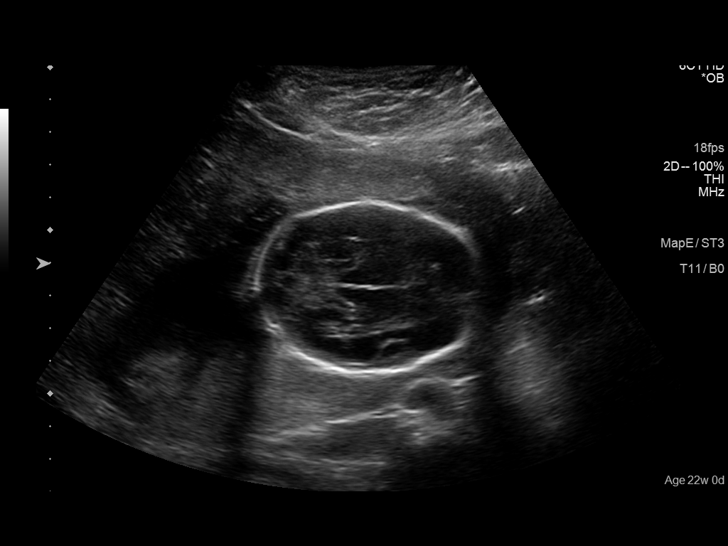
[im 34/82]
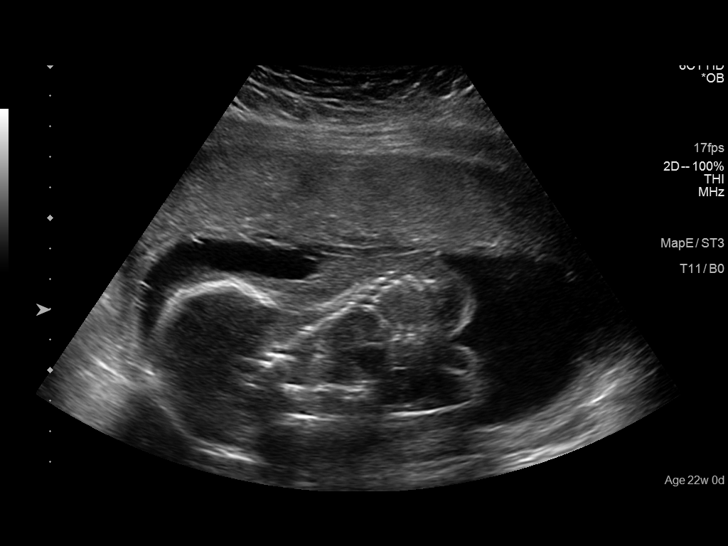
[im 40/82]
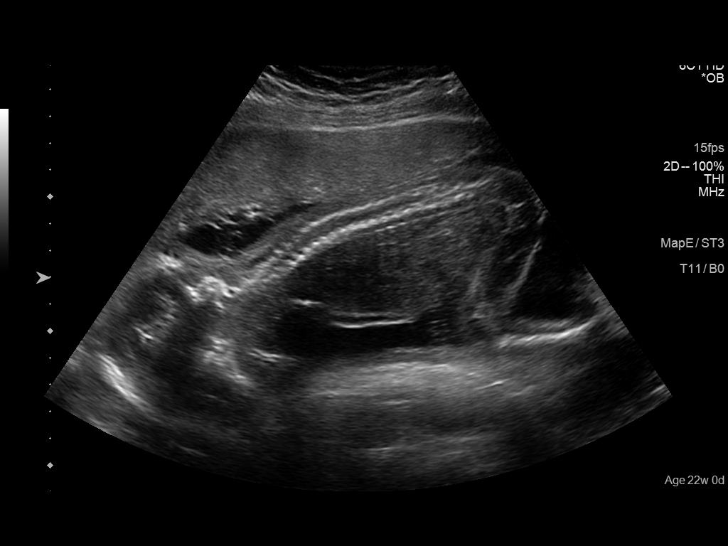
[im 46/82]
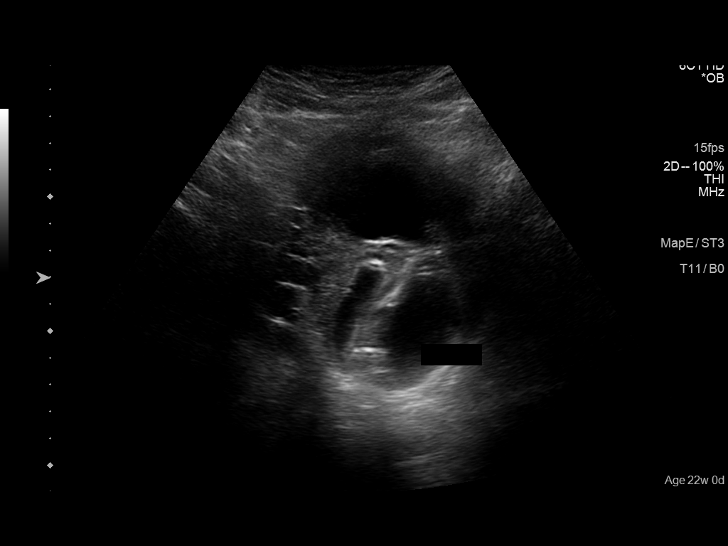
[im 52/82]
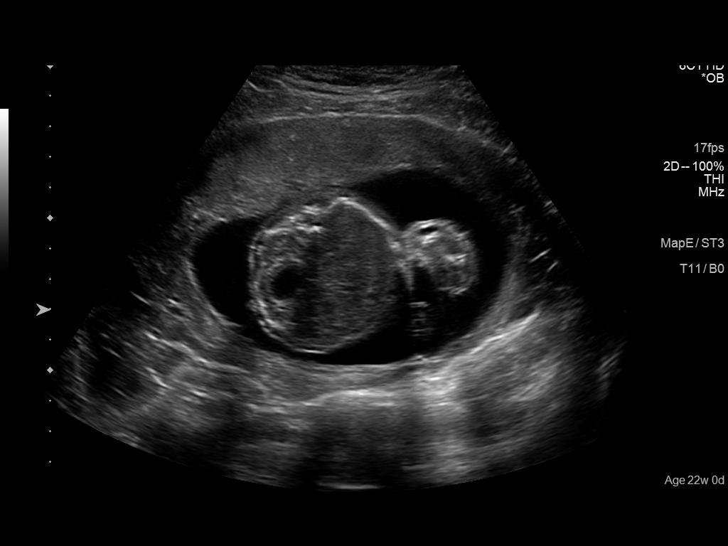
[im 58/82]
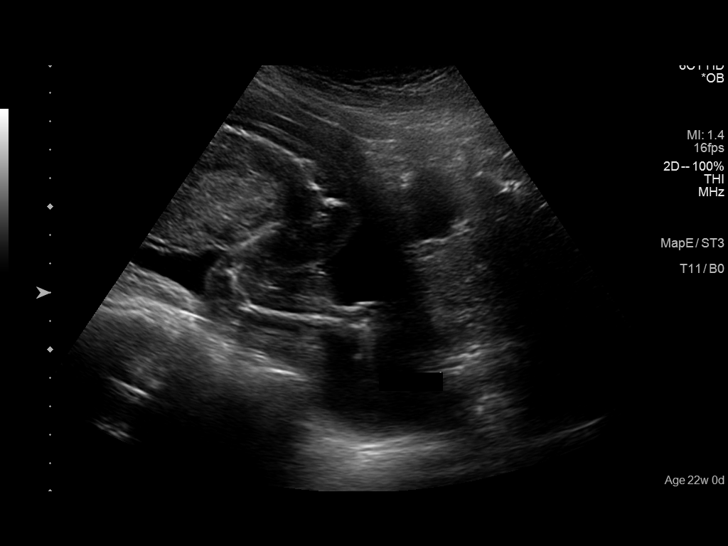
[im 64/82]
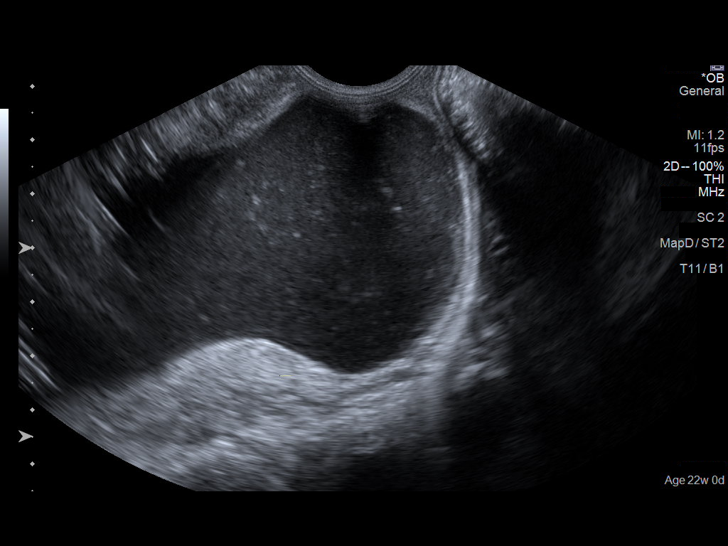
[im 70/82]
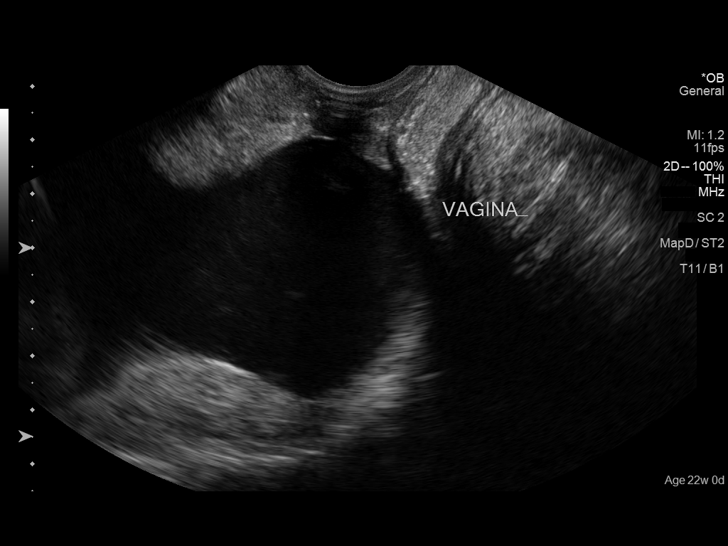
[im 76/82]
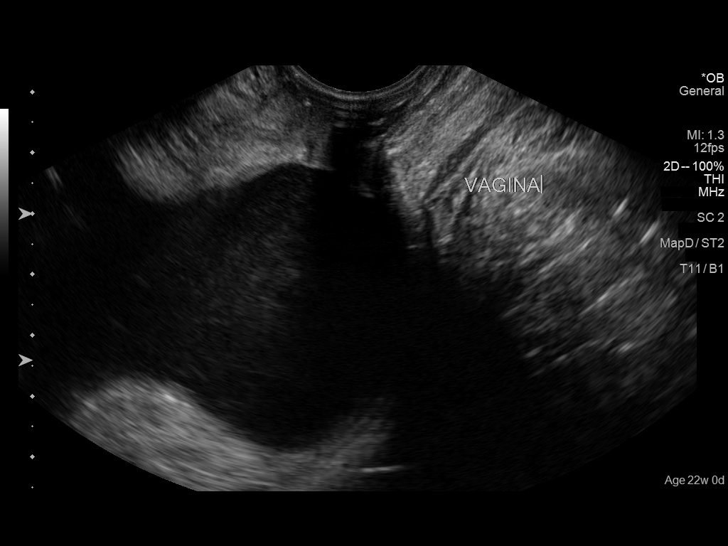
[im 82/82]
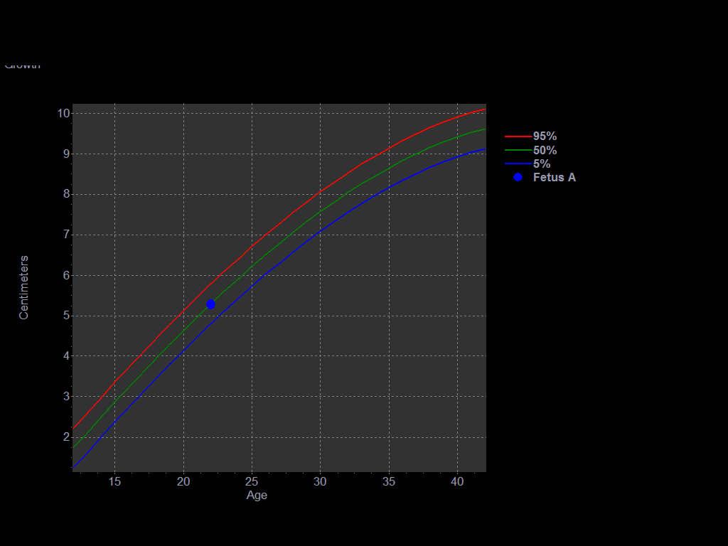

[14 of 28 positions shown; findings below may reference images not displayed]

FINDINGS: Number of Fetuses:  1

Heart Rate:  153 bpm

Movement:  Yes per sonographer exam

Presentation: Breech

Previa:  No

Placental Location: Anterior

Amniotic Fluid (Subjective): Within normal limits.

BPD:  5.28cm 22w  0d

MATERNAL FINDINGS:

Cervix: Open with bulging membranes. A fetal foot projects into the
open cervical canal.

Uterus/Adnexae:  No abnormality visualized.

This exam is performed on an emergent basis and does not
comprehensively evaluate fetal size, dating, or anatomy. Recent
anatomic survey performed.
IMPRESSION: 1. Open cervix with bulging membranes.
2. Normal amniotic fluid volume.

## 2018-02-02 IMAGING — US US OB COMP LESS 14 WK
1 series · 13 of 28 positions shown · non-contrast
Comparison: None.

CLINICAL DATA: Bleeding and cramping since this morning.

EXAM:
OBSTETRIC <14 WK US AND TRANSVAGINAL OB US
TECHNIQUE: Both transabdominal and transvaginal ultrasound examinations were
performed for complete evaluation of the gestation as well as the
maternal uterus, adnexal regions, and pelvic cul-de-sac.
Transvaginal technique was performed to assess early pregnancy.

[Series 1: us ob comp less 14 wk · 0.20mm/px · 13 of 90 slices shown]
[im 4/90]
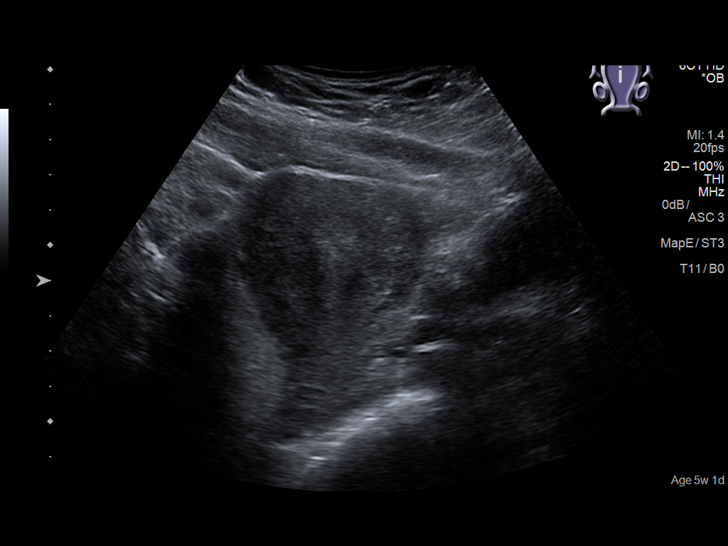
[im 10/90]
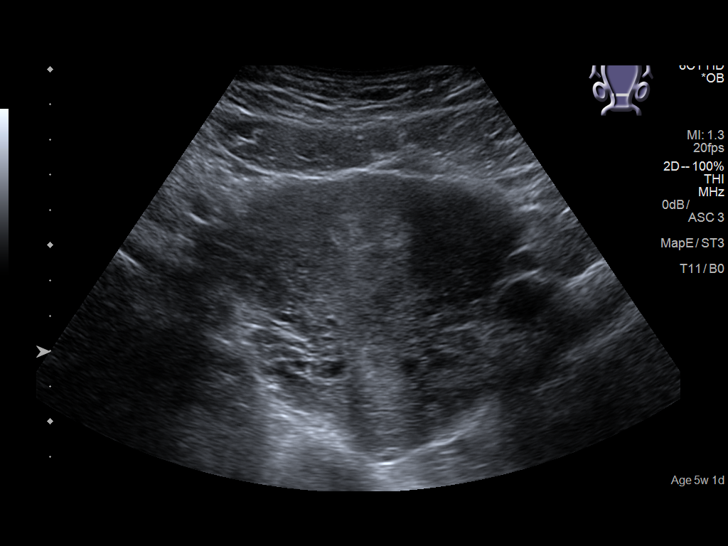
[im 17/90]
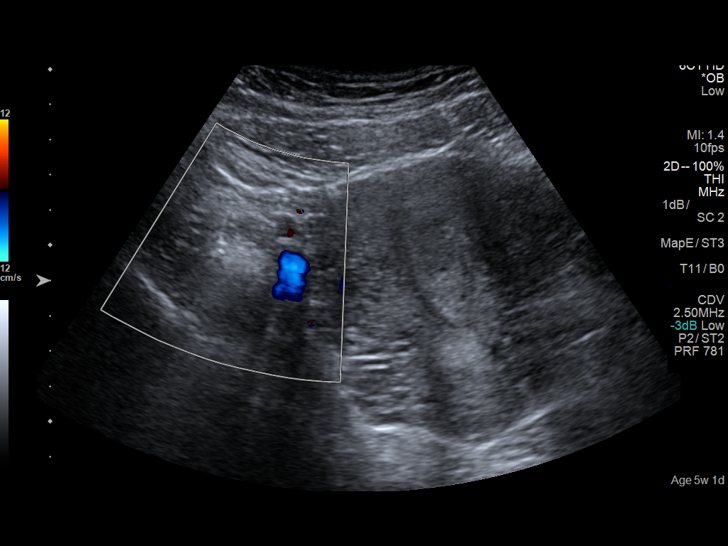
[im 24/90]
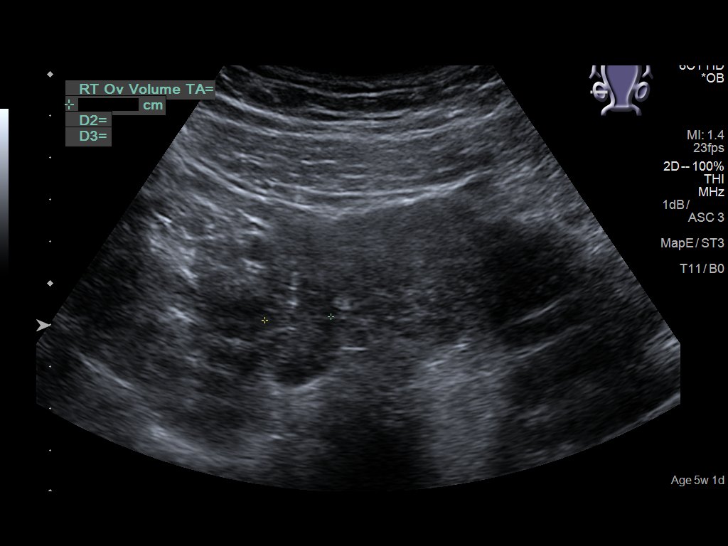
[im 30/90]
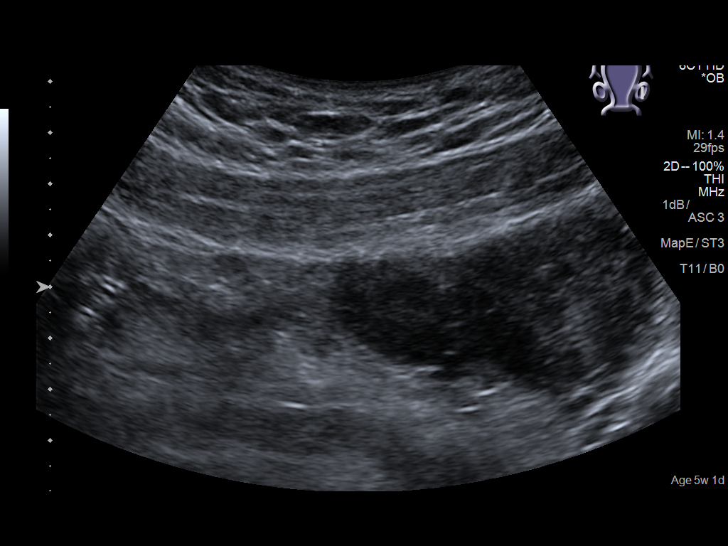
[im 37/90]
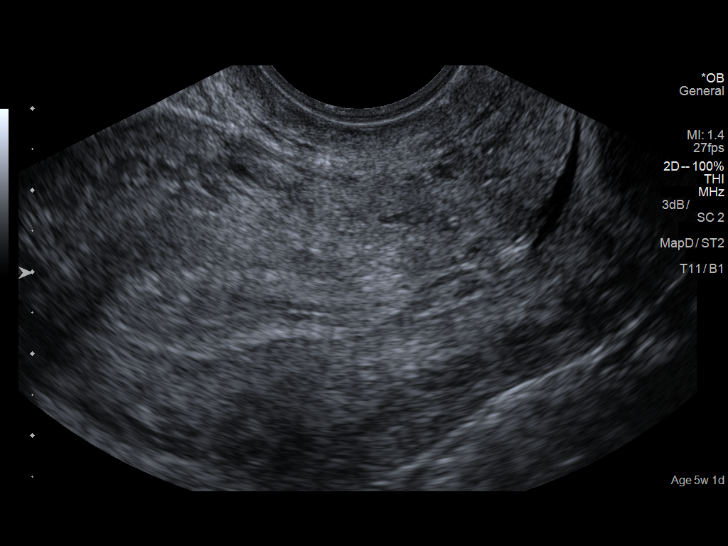
[im 47/90]
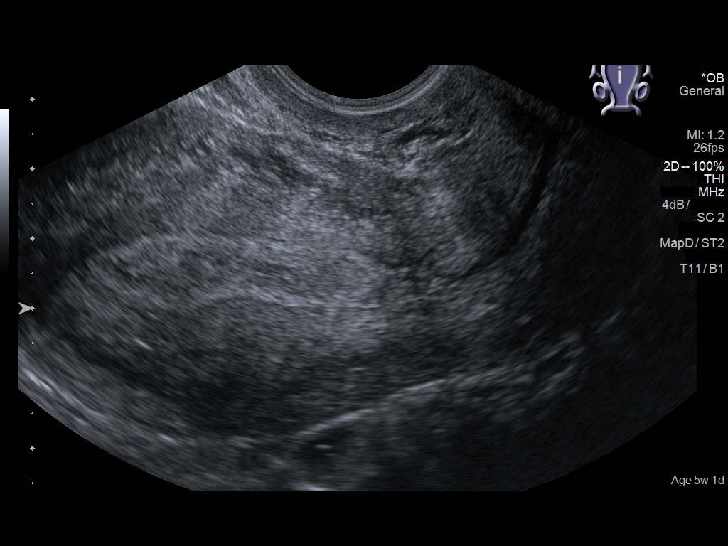
[im 53/90]
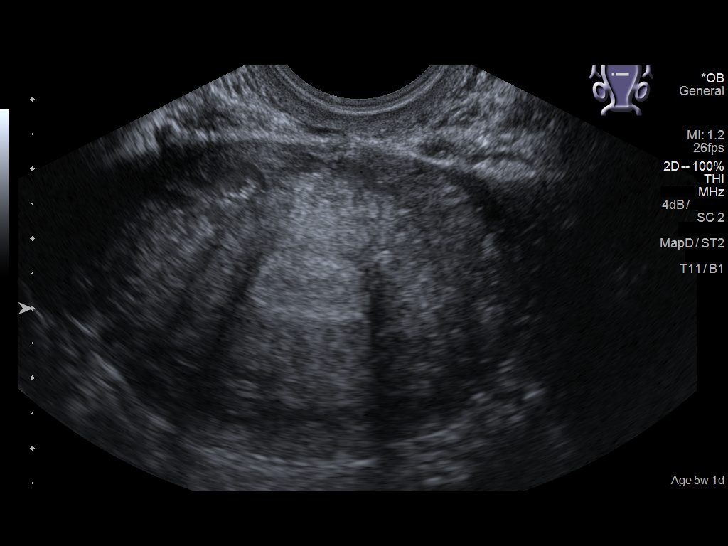
[im 60/90]
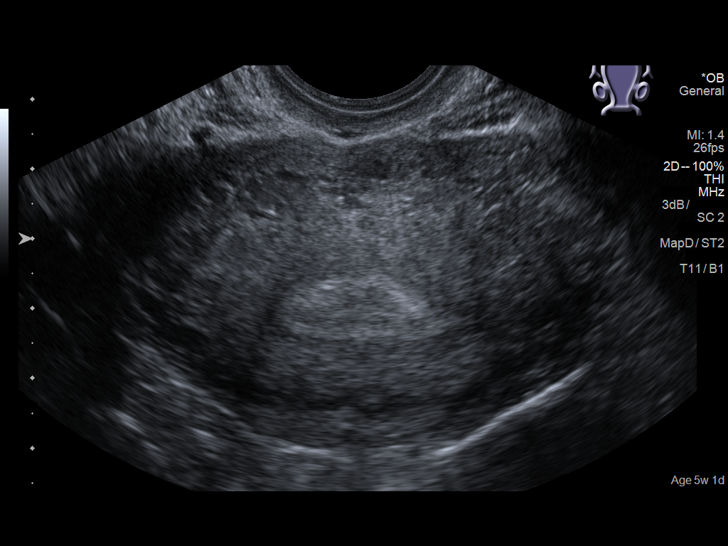
[im 66/90]
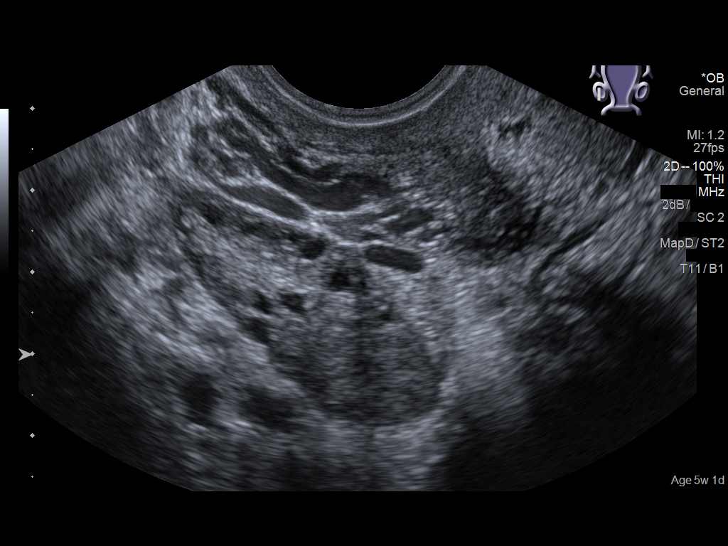
[im 73/90]
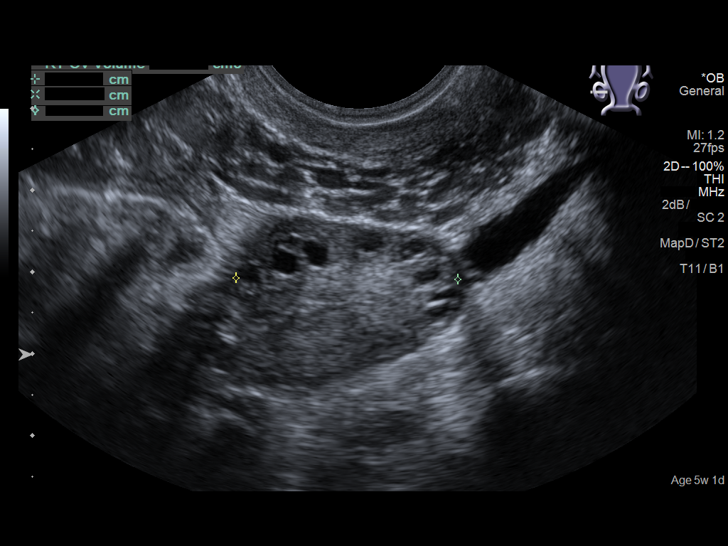
[im 80/90]
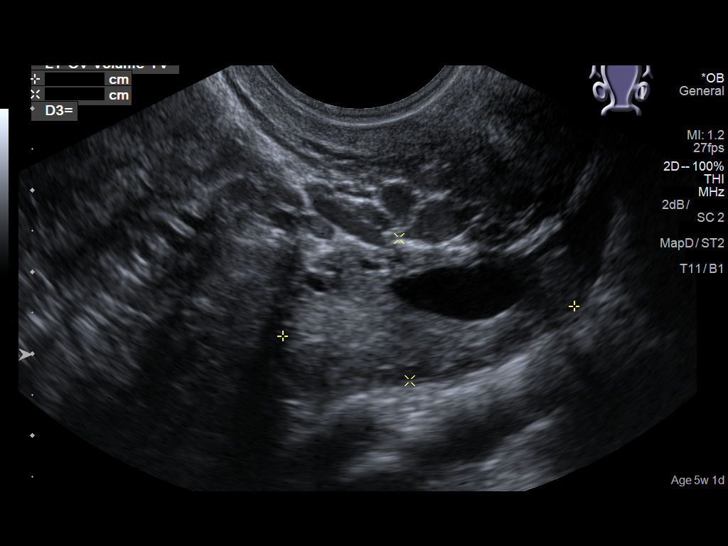
[im 86/90]
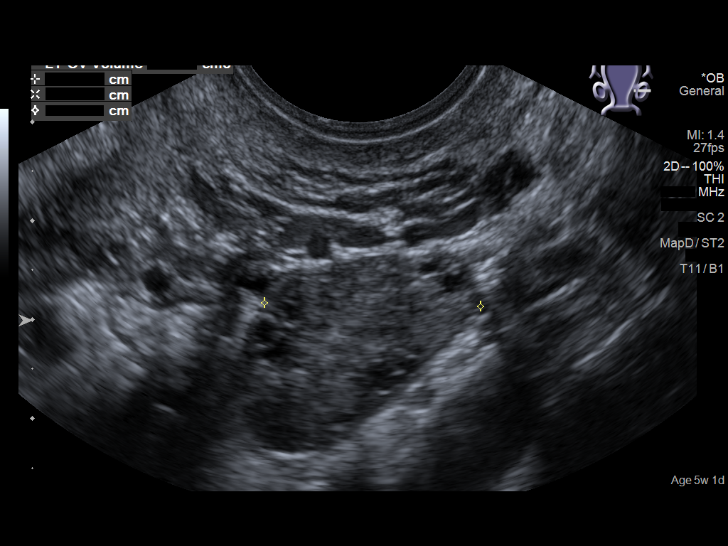

[13 of 28 positions shown; findings below may reference images not displayed]

FINDINGS: Intrauterine gestational sac: None seen. Endometrial complex normal
in thickness at 11 mm. Small amount of fluid/debris within the
endometrial canal. Additional small amount of fluid at the
endocervical canal.

Yolk sac:  Not seen

Embryo:  Not seen

Cardiac Activity: Not seen

Maternal uterus/adnexae: Both ovaries appear normal. No mass or free
fluid within either adnexal region. Small amount of free fluid in
the cul-de-sac is likely physiologic in nature.
IMPRESSION: 1. No intrauterine pregnancy identified.
2. No evidence of ectopic pregnancy seen. No mass or free fluid
within either adnexal region.
3. Small amount of fluid/debris within the endometrial canal and
endocervical canal.
Recommend follow-up with serial beta HCG levels, and pelvic
ultrasound as needed, to exclude early/occult intrauterine or
ectopic pregnancy.

## 2019-03-31 ENCOUNTER — Ambulatory Visit: Payer: Self-pay

## 2019-07-31 ENCOUNTER — Other Ambulatory Visit: Payer: Self-pay

## 2019-07-31 DIAGNOSIS — Z20822 Contact with and (suspected) exposure to covid-19: Secondary | ICD-10-CM

## 2019-08-01 LAB — NOVEL CORONAVIRUS, NAA: SARS-CoV-2, NAA: NOT DETECTED
